# Patient Record
Sex: Male | Born: 1935 | Hispanic: No | Marital: Married | Smoking: Never smoker
Health system: Southern US, Community
[De-identification: ages and names within clinical notes are randomized; demographics above are authoritative.]

## PROBLEM LIST (undated history)

## (undated) DIAGNOSIS — I10 Essential (primary) hypertension: Secondary | ICD-10-CM

## (undated) DIAGNOSIS — N400 Enlarged prostate without lower urinary tract symptoms: Secondary | ICD-10-CM

## (undated) DIAGNOSIS — E119 Type 2 diabetes mellitus without complications: Secondary | ICD-10-CM

## (undated) DIAGNOSIS — I2510 Atherosclerotic heart disease of native coronary artery without angina pectoris: Secondary | ICD-10-CM

## (undated) DIAGNOSIS — M316 Other giant cell arteritis: Secondary | ICD-10-CM

## (undated) HISTORY — PX: CORONARY ANGIOPLASTY WITH STENT PLACEMENT: SHX49

---

## 2015-08-27 ENCOUNTER — Other Ambulatory Visit: Payer: Self-pay

## 2015-08-27 ENCOUNTER — Encounter (HOSPITAL_COMMUNITY): Payer: Self-pay | Admitting: Emergency Medicine

## 2015-08-27 ENCOUNTER — Observation Stay (HOSPITAL_COMMUNITY)
Admission: EM | Admit: 2015-08-27 | Discharge: 2015-08-27 | Disposition: A | Discharge status: Home / Self Care | Payer: Medicare Other | Attending: Family Medicine | Admitting: Family Medicine

## 2015-08-27 ENCOUNTER — Emergency Department (HOSPITAL_COMMUNITY): Payer: Medicare Other

## 2015-08-27 DIAGNOSIS — Z794 Long term (current) use of insulin: Secondary | ICD-10-CM | POA: Diagnosis not present

## 2015-08-27 DIAGNOSIS — Z7982 Long term (current) use of aspirin: Secondary | ICD-10-CM | POA: Insufficient documentation

## 2015-08-27 DIAGNOSIS — R079 Chest pain, unspecified: Secondary | ICD-10-CM | POA: Diagnosis not present

## 2015-08-27 DIAGNOSIS — K210 Gastro-esophageal reflux disease with esophagitis: Secondary | ICD-10-CM | POA: Diagnosis not present

## 2015-08-27 DIAGNOSIS — M25519 Pain in unspecified shoulder: Secondary | ICD-10-CM | POA: Insufficient documentation

## 2015-08-27 DIAGNOSIS — R05 Cough: Secondary | ICD-10-CM

## 2015-08-27 DIAGNOSIS — N400 Enlarged prostate without lower urinary tract symptoms: Secondary | ICD-10-CM | POA: Diagnosis not present

## 2015-08-27 DIAGNOSIS — I10 Essential (primary) hypertension: Secondary | ICD-10-CM | POA: Diagnosis not present

## 2015-08-27 DIAGNOSIS — Z955 Presence of coronary angioplasty implant and graft: Secondary | ICD-10-CM | POA: Insufficient documentation

## 2015-08-27 DIAGNOSIS — I2510 Atherosclerotic heart disease of native coronary artery without angina pectoris: Secondary | ICD-10-CM | POA: Insufficient documentation

## 2015-08-27 DIAGNOSIS — K219 Gastro-esophageal reflux disease without esophagitis: Secondary | ICD-10-CM

## 2015-08-27 DIAGNOSIS — Z7902 Long term (current) use of antithrombotics/antiplatelets: Secondary | ICD-10-CM | POA: Diagnosis not present

## 2015-08-27 DIAGNOSIS — E119 Type 2 diabetes mellitus without complications: Secondary | ICD-10-CM | POA: Diagnosis not present

## 2015-08-27 DIAGNOSIS — D179 Benign lipomatous neoplasm, unspecified: Secondary | ICD-10-CM | POA: Diagnosis not present

## 2015-08-27 DIAGNOSIS — Z79899 Other long term (current) drug therapy: Secondary | ICD-10-CM | POA: Diagnosis not present

## 2015-08-27 DIAGNOSIS — R059 Cough, unspecified: Secondary | ICD-10-CM

## 2015-08-27 HISTORY — DX: Atherosclerotic heart disease of native coronary artery without angina pectoris: I25.10

## 2015-08-27 HISTORY — DX: Type 2 diabetes mellitus without complications: E11.9

## 2015-08-27 HISTORY — DX: Essential (primary) hypertension: I10

## 2015-08-27 HISTORY — DX: Benign prostatic hyperplasia without lower urinary tract symptoms: N40.0

## 2015-08-27 HISTORY — DX: Other giant cell arteritis: M31.6

## 2015-08-27 LAB — I-STAT CHEM 8, ED
BUN: 23 mg/dL — ABNORMAL HIGH (ref 6–20)
CALCIUM ION: 1.28 mmol/L (ref 1.13–1.30)
CHLORIDE: 104 mmol/L (ref 101–111)
Creatinine, Ser: 1.3 mg/dL — ABNORMAL HIGH (ref 0.61–1.24)
Glucose, Bld: 151 mg/dL — ABNORMAL HIGH (ref 65–99)
HCT: 46 % (ref 39.0–52.0)
Hemoglobin: 15.6 g/dL (ref 13.0–17.0)
Potassium: 3.6 mmol/L (ref 3.5–5.1)
SODIUM: 143 mmol/L (ref 135–145)
TCO2: 29 mmol/L (ref 0–100)

## 2015-08-27 LAB — BASIC METABOLIC PANEL
ANION GAP: 7 (ref 5–15)
BUN: 21 mg/dL — ABNORMAL HIGH (ref 6–20)
CO2: 28 mmol/L (ref 22–32)
Calcium: 9.5 mg/dL (ref 8.9–10.3)
Chloride: 106 mmol/L (ref 101–111)
Creatinine, Ser: 1.33 mg/dL — ABNORMAL HIGH (ref 0.61–1.24)
GFR, EST AFRICAN AMERICAN: 55 mL/min — AB (ref >60)
GFR, EST NON AFRICAN AMERICAN: 48 mL/min — AB (ref >60)
Glucose, Bld: 150 mg/dL — ABNORMAL HIGH (ref 65–99)
POTASSIUM: 3.5 mmol/L (ref 3.5–5.1)
SODIUM: 141 mmol/L (ref 135–145)

## 2015-08-27 LAB — CBC
HEMATOCRIT: 41.8 % (ref 39.0–52.0)
HEMOGLOBIN: 13.1 g/dL (ref 13.0–17.0)
MCH: 18.5 pg — ABNORMAL LOW (ref 26.0–34.0)
MCHC: 31.3 g/dL (ref 30.0–36.0)
MCV: 59 fL — ABNORMAL LOW (ref 78.0–100.0)
Platelets: 230 10*3/uL (ref 150–400)
RBC: 7.09 MIL/uL — AB (ref 4.22–5.81)
RDW: 18.6 % — AB (ref 11.5–15.5)
WBC: 7.6 10*3/uL (ref 4.0–10.5)

## 2015-08-27 LAB — I-STAT TROPONIN, ED: Troponin i, poc: 0 ng/mL (ref 0.00–0.08)

## 2015-08-27 MED ORDER — CLONIDINE HCL 0.1 MG PO TABS
0.1000 mg | ORAL_TABLET | Freq: Once | ORAL | Status: AC
  Administered 2015-08-27: 0.1 mg via ORAL
  Filled 2015-08-27: qty 1

## 2015-08-27 MED ORDER — METHOCARBAMOL 500 MG PO TABS: 500.0000 mg | ORAL_TABLET | Freq: Three times a day (TID) | ORAL | Status: DC | PRN

## 2015-08-27 MED ORDER — IOPAMIDOL (ISOVUE-370) INJECTION 76%
INTRAVENOUS | Status: AC
  Administered 2015-08-27: 100 mL
  Filled 2015-08-27: qty 100

## 2015-08-27 MED ORDER — GI COCKTAIL ~~LOC~~
30.0000 mL | Freq: Once | ORAL | Status: AC
  Administered 2015-08-27: 30 mL via ORAL
  Filled 2015-08-27: qty 30

## 2015-08-27 MED ORDER — HYDROCHLOROTHIAZIDE 12.5 MG PO TABS: 12.5000 mg | ORAL_TABLET | Freq: Every day | ORAL | Status: AC

## 2015-08-27 MED ORDER — HYDROCHLOROTHIAZIDE 12.5 MG PO CAPS
12.5000 mg | ORAL_CAPSULE | Freq: Once | ORAL | Status: AC
  Administered 2015-08-27: 12.5 mg via ORAL
  Filled 2015-08-27: qty 1

## 2015-08-27 MED ORDER — ASPIRIN 81 MG PO CHEW
324.0000 mg | CHEWABLE_TABLET | Freq: Once | ORAL | Status: AC
  Administered 2015-08-27: 324 mg via ORAL
  Filled 2015-08-27: qty 4

## 2015-08-27 MED ORDER — METHOCARBAMOL 1000 MG/10ML IJ SOLN
500.0000 mg | Freq: Once | INTRAVENOUS | Status: DC
  Filled 2015-08-27: qty 5

## 2015-08-27 MED ORDER — METHOCARBAMOL 500 MG PO TABS
500.0000 mg | ORAL_TABLET | Freq: Once | ORAL | Status: AC
  Administered 2015-08-27: 500 mg via ORAL
  Filled 2015-08-27: qty 1

## 2015-08-27 NOTE — ED Notes (Signed)
Spoke to Dr. Marily Memos due to pt family requesting to speak to him. MD will go to bedside in order to decide disposition.

## 2015-08-27 NOTE — ED Notes (Signed)
Pt placement and 3W aware that patient will be discharged.

## 2015-08-27 NOTE — ED Notes (Signed)
Admitting MD at bedside.

## 2015-08-27 NOTE — H&P (Deleted)
ENTERED IN ERROR   ROS

## 2015-08-27 NOTE — ED Notes (Signed)
Pt. Stated, I got up this morning and after i did my exercise, I started having some right side chest pain, and my shoulder pain.

## 2015-08-27 NOTE — ED Provider Notes (Signed)
CSN: [id number]     Arrival date & time 08/27/15  [id number] History   First MD Initiated Contact with Patient 08/27/15 [phone]     Chief Complaint  Patient presents with  . Chest Pain  . Shoulder Pain    HPI Comments: 82 year old male with a past medical history of coronary artery disease status post 3 stents and hypertension presents to the ED with sharp left-sided chest pain that is located deep to his scapula. He feels this is related to swimming yesterday, and exercising this morning. He did not have any chest pain while exercising. The pain began 10 minutes after finishing his workout. No associated nausea, vomiting, diaphoresis, syncope. The pain is nonradiating. It lasts "seconds".  Patient is a 82 y.o. male presenting with chest pain. The history is provided by the patient.  Chest Pain Pain location:  L chest (Described as deep inside under the shoulder blade) Pain quality: sharp   Pain radiates to:  Does not radiate Pain severity:  Severe Onset quality:  Sudden Duration: Began this morning; lasts 3 seconds, occurs intermittently. Timing:  Intermittent Progression:  Waxing and waning Chronicity:  New Context: at rest   Relieved by:  None tried Worsened by:  Nothing tried Ineffective treatments:  None tried Associated symptoms: no abdominal pain, no fever, no nausea, no shortness of breath and not vomiting   Risk factors: coronary artery disease and hypertension     Past Medical History  Diagnosis Date  . Hypertension   . Coronary artery disease     stents placed  . BPH (benign prostatic hyperplasia)   . DM (diabetes mellitus) ([place])    Past Surgical History  Procedure Laterality Date  . Coronary angioplasty with stent placement     No family history on file. Social History  Substance Use Topics  . Smoking status: Never Smoker   . Smokeless tobacco: None  . Alcohol Use: Yes    Review of Systems  Constitutional: Negative for fever and chills.  HENT: Negative for  congestion.   Eyes: Negative for redness.  Respiratory: Negative for shortness of breath.   Cardiovascular: Positive for chest pain.  Gastrointestinal: Negative for nausea, vomiting, abdominal pain and diarrhea.  Genitourinary: Negative for flank pain.  Musculoskeletal: Negative for gait problem.  Skin: Negative for rash.  Neurological: Negative for syncope and light-headedness.  Psychiatric/Behavioral: Negative for confusion.    Allergies  Review of patient's allergies indicates not on file.  Home Medications   Prior to Admission medications   Not on File   BP 175/63 mmHg  Pulse 50  Temp(Src) 98.6 F (37 C) (Oral)  Resp 16  Ht 5\' 4"  (1.626 m)  Wt 79.833 kg  BMI 30.20 kg/m2  SpO2 100% Physical Exam  Constitutional: He is oriented to person, place, and time. He appears well-developed and well-nourished. No distress.  HENT:  Head: Normocephalic and atraumatic.  Right Ear: External ear normal.  Left Ear: External ear normal.  Neck: Normal range of motion.  Cardiovascular: Regular rhythm and intact distal pulses.  Bradycardia present.   Pulses:      Radial pulses are 2+ on the right side, and 2+ on the left side.       Posterior tibial pulses are 2+ on the right side, and 2+ on the left side.  HR 50s, intact distal pulses, no peripheral edema  Pulmonary/Chest: Effort normal and breath sounds normal.  Abdominal: Soft. Bowel sounds are normal. He exhibits no distension. There is no tenderness.  Musculoskeletal:       Left shoulder: He exhibits normal range of motion and no tenderness.  LUE: No pain with ROM, normal strength in major muscle groups, no pain with palpation of the scapula  Neurological: He is alert and oriented to person, place, and time.  Normal strength in major muscle groups of upper and lower extremities, face symmetric, speech clear and appropriate  Skin: Skin is warm and dry. He is not diaphoretic.  Psychiatric: He has a normal mood and affect.    ED  Course  Procedures  Labs Review Labs Reviewed  BASIC METABOLIC PANEL - Abnormal; Notable for the following:    Glucose, Bld 150 (*)    BUN 21 (*)    Creatinine, Ser 1.33 (*)    GFR calc non Af Amer 48 (*)    GFR calc Af Amer 55 (*)    All other components within normal limits  CBC - Abnormal; Notable for the following:    RBC 7.09 (*)    MCV 59.0 (*)    MCH 18.5 (*)    RDW 18.6 (*)    All other components within normal limits  I-STAT CHEM 8, ED - Abnormal; Notable for the following:    BUN 23 (*)    Creatinine, Ser 1.30 (*)    Glucose, Bld 151 (*)    All other components within normal limits  I-STAT TROPOININ, ED    Imaging Review Dg Chest 2 View  08/27/2015  CLINICAL DATA:  Right-sided chest pain following exercise, initial encounter EXAM: CHEST  2 VIEW COMPARISON:  None. FINDINGS: The heart size and mediastinal contours are within normal limits. Both lungs are clear. The visualized skeletal structures are unremarkable. IMPRESSION: No active cardiopulmonary disease. Electronically Signed   By: Inez Catalina M.D.   On: 08/27/2015 09:19   Ct Angio Chest Pe W/cm &/or Wo Cm  08/27/2015  CLINICAL DATA:  Sharp left-sided chest pain under the shoulder blade. EXAM: CT ANGIOGRAPHY CHEST WITH CONTRAST TECHNIQUE: Multidetector CT imaging of the chest was performed using the standard protocol during bolus administration of intravenous contrast. Multiplanar CT image reconstructions and MIPs were obtained to evaluate the vascular anatomy. CONTRAST:  100 cc Isovue 370 intravenous COMPARISON:  None. FINDINGS: Mediastinum/Lymph Nodes: Normal heart size. No pericardial effusion. Non opacification of the left heart and systemic arterial tree. No evidence of acute aortic syndrome. No evidence of pulmonary embolism. Atherosclerosis, including the coronary arteries. Negative for adenopathy. Lungs/Pleura: Increased density the lungs is likely from expiratory phase imaging. There is mild air trapping at the  bases. Emphysematous changes at the right more than left apex. No suspicious pulmonary nodule. Upper abdomen: No acute findings. Musculoskeletal: Partially visualized deltoid lipoma on the right measuring at least 4 cm, simple where seen. Diffuse disc degeneration. Advanced bilateral glenohumeral osteoarthritis. Review of the MIP images confirms the above findings. IMPRESSION: 1. No evidence of pulmonary embolism or other acute finding. 2. Aortic atherosclerosis and other chronic findings noted above. Electronically Signed   By: Monte Fantasia M.D.   On: 08/27/2015 10:29   I have personally reviewed and evaluated these images and lab results as part of my medical decision-making.   EKG Interpretation   Date/Time:  Monday August 27 2015 08:24:56 EDT Ventricular Rate:  51 PR Interval:  184 QRS Duration: 86 QT Interval:  450 QTC Calculation: 414 R Axis:   55 Text Interpretation:  Sinus bradycardia Otherwise normal ECG Confirmed by  Lita Mains  MD, DAVID ([id number]) on  08/27/2015 9:07:08 AM      MDM   Final diagnoses:  Chest pain, unspecified chest pain type    82 y.o. male presents with sharp left sided chest pain. Remainder as above. The patient feels that this pain may be attributed to his work out, however he has no reproducible pain over the scapula, and no pain with active ROM of the shoulder.   EKG showed sinus bradycardia.   Given the description of his pain, and location, considered ACS, PE, Aortic dissection. Initial troponin undetectable. CTA showed no PE or aortic dissection. CBC and BMP unremarkable. Aspirin given.   Based on his age, history of CAD with stents, and intermittent chest pain, I discussed his case with the hospitalist for admission. He was admitted to a telemetry bed for further evaluation and management of his chest pain.   1:40 PM Addendum:  Patient was seen and evaluated by Dr. Marily Memos, hospitalist, who recommended admission. The patient preferred to go home. See  note by Dr. Marily Memos. Dr. Marily Memos recommended starting the patient on HCTZ and Robaxin. I discussed this with the patient and he continued to prefer to go home. I discussed very strict return precautions with him and his family. Discharged home.   Case managed in conjunction with my attending, Dr. Lita Mains.   Berenice Primas, MD 08/27/15 1151  Berenice Primas, MD 08/27/15 [id number]  Julianne Rice, MD 08/31/15 [id number]

## 2015-08-27 NOTE — Discharge Instructions (Signed)

## 2015-08-27 NOTE — Consult Note (Signed)
Medical Consultation   Peter Carter  [id number]  DOB: 07/02/1929  DOA: 08/27/2015  PCP: No primary care provider on file.   Outpatient Specialists: Cardiology  Requesting physician: Dr Marigene Ehlers  Reason for consultation: CP, HTN   History of Present Illness: Patient coming from: Home - visiting from [place]  Chief Complaint: CP  HPI: Peter Carter is a 82 y.o. male with medical history significant of hypertension, CAD status post cardiac catheterization with stent placement BPH, GERD, diabetes presenting w/ CP Scapular pain. It patient states it is near the scapula in the chest. Patient is very active and went swimming one day ago which is part of his usual routine. He does not have any chest pain during exertion. Pain began 10 minutes after finishing his workout. The pain typically lasts several seconds and then resolve spontaneously. No change with movement or deep inspiration. Patient is not tried anything for his symptoms. Pain often comes in 3- 4 episodes in rapid succession lasting only a few seconds at time and roughly 10 seconds apart. Pt also endorses worsening cough lately which he attributes to his reflux. Worse since coming down from [place] to visit. Pt states he had a cardiac cath a few months ago which was normal.   Denies fevers, productive cough, shortness of breath, rash, neck stiffness, melena, headache, dysuria, frequency, flank pain, abdominal pain, diarrhea, nausea, diaphoresis.  ED Course: No meds given, CTA chest without evidence of acute process. Bony negative and EKG without evidence of ACS    Ambulatory Status: no restrictions    ROS Review of Systems: As per HPI otherwise 10 point review of systems negative.   Past Medical History: Past Medical History  Diagnosis Date  . Hypertension   . Coronary artery disease     stents placed  . BPH (benign prostatic hyperplasia)   . DM (diabetes mellitus) ([place])   . Giant cell arteritis [facility])      Past Surgical History: Past Surgical History  Procedure Laterality Date  . Coronary angioplasty with stent placement       Allergies:  No Known Allergies   Social History:  reports that he has never smoked. He does not have any smokeless tobacco history on file. He reports that he drinks alcohol. He reports that he does not use illicit drugs.   Family History: Family History  Problem Relation Age of Onset  . Stroke Mother      Physical Exam: Filed Vitals:   08/27/15 1257 08/27/15 1300 08/27/15 1315 08/27/15 1317  BP: 170/63 171/62 161/56 161/56  Pulse:  46 45 46  Temp:    98 F (36.7 C)  TempSrc:    Oral  Resp:  14 14 13   Height:      Weight:      SpO2:  98% 98% 98%   Prior to Admission medications   Medication Sig Start Date End Date Taking? Authorizing Provider  Ascorbic Acid (VITAMIN C) 100 MG tablet Take 100 mg by mouth daily.   Yes Historical Provider, MD  aspirin 81 MG tablet Take 81 mg by mouth daily.   Yes Historical Provider, MD  atenolol (TENORMIN) 25 MG tablet Take 25 mg by mouth daily.   Yes Historical Provider, MD  Cholecalciferol (VITAMIN D PO) Take 1 capsule by mouth daily.   Yes Historical Provider, MD  clopidogrel (PLAVIX) 75 MG tablet Take 75 mg by mouth daily.   Yes Historical Provider,  MD  Cyanocobalamin (VITAMIN B 12 PO) Take 1 tablet by mouth daily.   Yes Historical Provider, MD  dutasteride (AVODART) 0.5 MG capsule Take 0.5 mg by mouth daily.   Yes Historical Provider, MD  esomeprazole (NEXIUM) 40 MG capsule Take 40 mg by mouth daily at 12 noon.   Yes Historical Provider, MD  fenofibrate (TRICOR) 145 MG tablet Take 145 mg by mouth daily.   Yes Historical Provider, MD  finasteride (PROSCAR) 5 MG tablet Take 5 mg by mouth daily.   Yes Historical Provider, MD  insulin glargine (LANTUS) 100 UNIT/ML injection Inject 15-22 Units into the skin at bedtime.   Yes Historical Provider, MD  insulin lispro protamine-lispro (HUMALOG 50/50 MIX) (50-50)  100 UNIT/ML SUSP injection Inject 30-34 Units into the skin 3 (three) times daily. Per sliding scale   Yes Historical Provider, MD  Omega-3 Fatty Acids (FISH OIL PO) Take 1 capsule by mouth daily.   Yes Historical Provider, MD  predniSONE (DELTASONE) 1 MG tablet Take 1 mg by mouth 4 (four) times daily.   Yes Historical Provider, MD  tamsulosin (FLOMAX) 0.4 MG CAPS capsule Take 0.4 mg by mouth daily.   Yes Historical Provider, MD    Physical Exam: Filed Vitals:   08/27/15 1257 08/27/15 1300 08/27/15 1315 08/27/15 1317  BP: 170/63 171/62 161/56 161/56  Pulse:  46 45 46  Temp:    98 F (36.7 C)  TempSrc:    Oral  Resp:  14 14 13   Height:      Weight:      SpO2:  98% 98% 98%     General:  Appears calm and comfortable Eyes:  PERRL, EOMI, normal lids, iris ENT:  grossly normal hearing, lips & tongue, mmm Neck:  no LAD, masses or thyromegaly Cardiovascular:  RRR, no m/r/g. No LE edema.  Respiratory:  CTA bilaterally, no w/r/r. Normal respiratory effort. Abdomen:  soft, ntnd, NABS Skin:  no rash or induration seen on limited exam Musculoskeletal: Pain reproducible upon palpation of large lipoma in the left scapular region, no bony abnormalities, grossly normal tone BUE/BLE, Psychiatric:  grossly normal mood and affect, speech fluent and appropriate, AOx3 Neurologic:  CN 2-12 grossly intact, moves all extremities in coordinated fashion, sensation intact  Data reviewed:  I have personally reviewed following labs and imaging studies Labs:  CBC:  Recent Labs Lab 08/27/15 0841 08/27/15 0918  WBC 7.6  --   HGB 13.1 15.6  HCT 41.8 46.0  MCV 59.0*  --   PLT 230  --     Basic Metabolic Panel:  Recent Labs Lab 08/27/15 0841 08/27/15 0918  NA 141 143  K 3.5 3.6  CL 106 104  CO2 28  --   GLUCOSE 150* 151*  BUN 21* 23*  CREATININE 1.33* 1.30*  CALCIUM 9.5  --    GFR Estimated Creatinine Clearance: 41 mL/min (by C-G formula based on Cr of 1.3). Liver Function Tests: No  results for input(s): AST, ALT, ALKPHOS, BILITOT, PROT, ALBUMIN in the last 168 hours. No results for input(s): LIPASE, AMYLASE in the last 168 hours. No results for input(s): AMMONIA in the last 168 hours. Coagulation profile No results for input(s): INR, PROTIME in the last 168 hours.  Cardiac Enzymes: No results for input(s): CKTOTAL, CKMB, CKMBINDEX, TROPONINI in the last 168 hours. BNP: Invalid input(s): POCBNP CBG: No results for input(s): GLUCAP in the last 168 hours. D-Dimer No results for input(s): DDIMER in the last 72 hours. Hgb A1c No  results for input(s): HGBA1C in the last 72 hours. Lipid Profile No results for input(s): CHOL, HDL, LDLCALC, TRIG, CHOLHDL, LDLDIRECT in the last 72 hours. Thyroid function studies No results for input(s): TSH, T4TOTAL, T3FREE, THYROIDAB in the last 72 hours.  Invalid input(s): FREET3 Anemia work up No results for input(s): VITAMINB12, FOLATE, FERRITIN, TIBC, IRON, RETICCTPCT in the last 72 hours. Urinalysis No results found for: COLORURINE, APPEARANCEUR, LABSPEC, [place], GLUCOSEU, HGBUR, BILIRUBINUR, KETONESUR, PROTEINUR, UROBILINOGEN, NITRITE, [place]   Microbiology No results found for this or any previous visit (from the past 240 hour(s)).     Inpatient Medications:   Scheduled Meds: Continuous Infusions:   Radiological Exams on Admission: Dg Chest 2 View  08/27/2015  CLINICAL DATA:  Right-sided chest pain following exercise, initial encounter EXAM: CHEST  2 VIEW COMPARISON:  None. FINDINGS: The heart size and mediastinal contours are within normal limits. Both lungs are clear. The visualized skeletal structures are unremarkable. IMPRESSION: No active cardiopulmonary disease. Electronically Signed   By: Inez Catalina M.D.   On: 08/27/2015 09:19   Ct Angio Chest Pe W/cm &/or Wo Cm  08/27/2015  CLINICAL DATA:  Sharp left-sided chest pain under the shoulder blade. EXAM: CT ANGIOGRAPHY CHEST WITH CONTRAST TECHNIQUE:  Multidetector CT imaging of the chest was performed using the standard protocol during bolus administration of intravenous contrast. Multiplanar CT image reconstructions and MIPs were obtained to evaluate the vascular anatomy. CONTRAST:  100 cc Isovue 370 intravenous COMPARISON:  None. FINDINGS: Mediastinum/Lymph Nodes: Normal heart size. No pericardial effusion. Non opacification of the left heart and systemic arterial tree. No evidence of acute aortic syndrome. No evidence of pulmonary embolism. Atherosclerosis, including the coronary arteries. Negative for adenopathy. Lungs/Pleura: Increased density the lungs is likely from expiratory phase imaging. There is mild air trapping at the bases. Emphysematous changes at the right more than left apex. No suspicious pulmonary nodule. Upper abdomen: No acute findings. Musculoskeletal: Partially visualized deltoid lipoma on the right measuring at least 4 cm, simple where seen. Diffuse disc degeneration. Advanced bilateral glenohumeral osteoarthritis. Review of the MIP images confirms the above findings. IMPRESSION: 1. No evidence of pulmonary embolism or other acute finding. 2. Aortic atherosclerosis and other chronic findings noted above. Electronically Signed   By: Monte Fantasia M.D.   On: 08/27/2015 10:29    Impression/Recommendations Active Problems:   Chest pain   Essential hypertension   Lipoma   GERD (gastroesophageal reflux disease)   Cough      CP: actually back pain that is reproducible at the location of a lipoma. Chest pain is nonexertional. Troponin negative, EKG without sign of ACS. Cardiac catheterization approximately one year ago without evidence of significant stenosis. This came per report by patient's daughter who called the cardiologist while the patient was in the emergency room. HEART score 4. Patient given the option of staying for serial troponins and telemetry monitoring but deferred as he states this is not like cardiac heart  pain. Of note patient is extremely active and never has exertional chest pain during activity. Patient will be going back to [place] in 2 days and strongly recommended he follow-up with his cardiologist when he gets back to [place]. Patient to return here if he has additional symptoms. Patient was provided with a dose of Robaxin and a GI cocktail. Patient will also follow-up with your primary care doctor or general surgery for excision of what is likely a lipoma versus a subcutaneous cyst. Additionally some of patient's symptoms may be  likely due to a cough causing pain as sometimes this does reproduce the pain as well. Suspect the patient's cough is likely from reflux as he has significant disease in this area. Symptoms of and worsens coming down from [place] with a change in his diet. Patient given a dose of GI cocktail with some improvement. The patient's blood pressure was intermittently elevated during his stay here suspect this of this was due to stress and pain. Patient was given a dose of clonidine with improvement in his blood pressure as well as hydralazine prior to discharge. Recommending a timed discharge patient given a perception for hydrochlorothiazide 12.5 mg as well as Robaxin for additional pain and pressure relief.  Thank you for this consultation.  Our [facility] hospitalist team will follow the patient with you.    Lashawnda Hancox J M.D. [facility] 08/27/2015, 3:11 PM

## 2016-10-23 ENCOUNTER — Emergency Department (HOSPITAL_COMMUNITY): Payer: Medicare Other

## 2016-10-23 ENCOUNTER — Other Ambulatory Visit: Payer: Self-pay

## 2016-10-23 ENCOUNTER — Observation Stay (HOSPITAL_COMMUNITY)
Admission: EM | Admit: 2016-10-23 | Discharge: 2016-10-24 | Disposition: A | Discharge status: Home / Self Care | Payer: Medicare Other | Attending: Internal Medicine | Admitting: Internal Medicine

## 2016-10-23 ENCOUNTER — Encounter (HOSPITAL_COMMUNITY): Payer: Self-pay | Admitting: Emergency Medicine

## 2016-10-23 DIAGNOSIS — M315 Giant cell arteritis with polymyalgia rheumatica: Secondary | ICD-10-CM | POA: Insufficient documentation

## 2016-10-23 DIAGNOSIS — Z7982 Long term (current) use of aspirin: Secondary | ICD-10-CM | POA: Insufficient documentation

## 2016-10-23 DIAGNOSIS — Z7952 Long term (current) use of systemic steroids: Secondary | ICD-10-CM | POA: Diagnosis not present

## 2016-10-23 DIAGNOSIS — I10 Essential (primary) hypertension: Secondary | ICD-10-CM | POA: Diagnosis not present

## 2016-10-23 DIAGNOSIS — N400 Enlarged prostate without lower urinary tract symptoms: Secondary | ICD-10-CM | POA: Insufficient documentation

## 2016-10-23 DIAGNOSIS — Z794 Long term (current) use of insulin: Secondary | ICD-10-CM | POA: Insufficient documentation

## 2016-10-23 DIAGNOSIS — I252 Old myocardial infarction: Secondary | ICD-10-CM | POA: Diagnosis not present

## 2016-10-23 DIAGNOSIS — E119 Type 2 diabetes mellitus without complications: Secondary | ICD-10-CM | POA: Insufficient documentation

## 2016-10-23 DIAGNOSIS — D563 Thalassemia minor: Secondary | ICD-10-CM | POA: Diagnosis not present

## 2016-10-23 DIAGNOSIS — I2510 Atherosclerotic heart disease of native coronary artery without angina pectoris: Principal | ICD-10-CM | POA: Insufficient documentation

## 2016-10-23 DIAGNOSIS — Z7983 Long term (current) use of bisphosphonates: Secondary | ICD-10-CM | POA: Diagnosis not present

## 2016-10-23 DIAGNOSIS — Z79899 Other long term (current) drug therapy: Secondary | ICD-10-CM | POA: Insufficient documentation

## 2016-10-23 DIAGNOSIS — Z955 Presence of coronary angioplasty implant and graft: Secondary | ICD-10-CM | POA: Diagnosis not present

## 2016-10-23 DIAGNOSIS — Z7902 Long term (current) use of antithrombotics/antiplatelets: Secondary | ICD-10-CM | POA: Insufficient documentation

## 2016-10-23 DIAGNOSIS — K219 Gastro-esophageal reflux disease without esophagitis: Secondary | ICD-10-CM | POA: Diagnosis not present

## 2016-10-23 DIAGNOSIS — R079 Chest pain, unspecified: Secondary | ICD-10-CM | POA: Diagnosis present

## 2016-10-23 LAB — BASIC METABOLIC PANEL
ANION GAP: 6 (ref 5–15)
BUN: 21 mg/dL — ABNORMAL HIGH (ref 6–20)
CO2: 25 mmol/L (ref 22–32)
Calcium: 9.1 mg/dL (ref 8.9–10.3)
Chloride: 104 mmol/L (ref 101–111)
Creatinine, Ser: 1.36 mg/dL — ABNORMAL HIGH (ref 0.61–1.24)
GFR calc non Af Amer: 46 mL/min — ABNORMAL LOW (ref >60)
GFR, EST AFRICAN AMERICAN: 53 mL/min — AB (ref >60)
GLUCOSE: 122 mg/dL — AB (ref 65–99)
POTASSIUM: 4.8 mmol/L (ref 3.5–5.1)
SODIUM: 135 mmol/L (ref 135–145)

## 2016-10-23 LAB — CBC
HEMATOCRIT: 39.9 % (ref 39.0–52.0)
HEMOGLOBIN: 13.2 g/dL (ref 13.0–17.0)
MCH: 19.8 pg — ABNORMAL LOW (ref 26.0–34.0)
MCHC: 33.1 g/dL (ref 30.0–36.0)
MCV: 59.7 fL — ABNORMAL LOW (ref 78.0–100.0)
Platelets: 213 10*3/uL (ref 150–400)
RBC: 6.68 MIL/uL — AB (ref 4.22–5.81)
RDW: 17.3 % — ABNORMAL HIGH (ref 11.5–15.5)
WBC: 10.7 10*3/uL — ABNORMAL HIGH (ref 4.0–10.5)

## 2016-10-23 LAB — I-STAT TROPONIN, ED
TROPONIN I, POC: 0 ng/mL (ref 0.00–0.08)
Troponin i, poc: 0.02 ng/mL (ref 0.00–0.08)

## 2016-10-23 NOTE — ED Triage Notes (Signed)
Onset today developed chest spasms and tightness intermittently. Denies shortness of breath and currently denies pain.  Patient had cardiac stents placed 2 months ago.

## 2016-10-23 NOTE — ED Triage Notes (Signed)
Patient's daughter came up to nurse's station, stating the patient is from [place] and his cardiologist is faxing over paperwork and recommends patient be admitted overnight for a stress test.

## 2016-10-24 ENCOUNTER — Encounter (HOSPITAL_COMMUNITY): Payer: Self-pay | Admitting: Family Medicine

## 2016-10-24 ENCOUNTER — Other Ambulatory Visit: Payer: Self-pay

## 2016-10-24 DIAGNOSIS — I2583 Coronary atherosclerosis due to lipid rich plaque: Secondary | ICD-10-CM | POA: Diagnosis not present

## 2016-10-24 DIAGNOSIS — R079 Chest pain, unspecified: Secondary | ICD-10-CM | POA: Diagnosis not present

## 2016-10-24 DIAGNOSIS — I10 Essential (primary) hypertension: Secondary | ICD-10-CM | POA: Diagnosis not present

## 2016-10-24 DIAGNOSIS — K210 Gastro-esophageal reflux disease with esophagitis: Secondary | ICD-10-CM | POA: Diagnosis not present

## 2016-10-24 DIAGNOSIS — Z794 Long term (current) use of insulin: Secondary | ICD-10-CM | POA: Diagnosis not present

## 2016-10-24 DIAGNOSIS — I2510 Atherosclerotic heart disease of native coronary artery without angina pectoris: Secondary | ICD-10-CM | POA: Diagnosis not present

## 2016-10-24 DIAGNOSIS — E119 Type 2 diabetes mellitus without complications: Secondary | ICD-10-CM

## 2016-10-24 DIAGNOSIS — R072 Precordial pain: Secondary | ICD-10-CM

## 2016-10-24 LAB — TROPONIN I

## 2016-10-24 LAB — CBG MONITORING, ED
GLUCOSE-CAPILLARY: 205 mg/dL — AB (ref 65–99)
GLUCOSE-CAPILLARY: 187 mg/dL — AB (ref 65–99)

## 2016-10-24 MED ORDER — HEPARIN SODIUM (PORCINE) 5000 UNIT/ML IJ SOLN
5000.0000 [IU] | Freq: Three times a day (TID) | INTRAMUSCULAR | Status: DC
  Administered 2016-10-24: 5000 [IU] via SUBCUTANEOUS
  Filled 2016-10-24: qty 1

## 2016-10-24 MED ORDER — FENOFIBRATE 160 MG PO TABS
160.0000 mg | ORAL_TABLET | Freq: Every day | ORAL | Status: DC
  Filled 2016-10-24: qty 1

## 2016-10-24 MED ORDER — DUTASTERIDE 0.5 MG PO CAPS
0.5000 mg | ORAL_CAPSULE | Freq: Every day | ORAL | Status: DC
  Filled 2016-10-24: qty 1

## 2016-10-24 MED ORDER — ASPIRIN 81 MG PO CHEW
81.0000 mg | CHEWABLE_TABLET | Freq: Every day | ORAL | Status: DC
  Filled 2016-10-24: qty 1

## 2016-10-24 MED ORDER — VITAMIN C 100 MG PO TABS: 100.0000 mg | ORAL_TABLET | Freq: Every day | ORAL | Status: DC

## 2016-10-24 MED ORDER — ONDANSETRON HCL 4 MG/2ML IJ SOLN: 4.0000 mg | Freq: Four times a day (QID) | INTRAMUSCULAR | Status: DC | PRN

## 2016-10-24 MED ORDER — MORPHINE SULFATE (PF) 4 MG/ML IV SOLN: 2.0000 mg | INTRAVENOUS | Status: DC | PRN

## 2016-10-24 MED ORDER — ASPIRIN 81 MG PO CHEW
324.0000 mg | CHEWABLE_TABLET | ORAL | Status: AC
  Administered 2016-10-24: 324 mg via ORAL
  Filled 2016-10-24: qty 4

## 2016-10-24 MED ORDER — CLOPIDOGREL BISULFATE 75 MG PO TABS
75.0000 mg | ORAL_TABLET | Freq: Every day | ORAL | Status: DC
  Administered 2016-10-24: 75 mg via ORAL
  Filled 2016-10-24: qty 1

## 2016-10-24 MED ORDER — NITROGLYCERIN 0.4 MG SL SUBL: 0.4000 mg | SUBLINGUAL_TABLET | SUBLINGUAL | Status: DC | PRN

## 2016-10-24 MED ORDER — VITAMIN D 1000 UNITS PO TABS: 1000.0000 [IU] | ORAL_TABLET | Freq: Every day | ORAL | Status: DC

## 2016-10-24 MED ORDER — ACETAMINOPHEN 325 MG PO TABS: 650.0000 mg | ORAL_TABLET | ORAL | Status: DC | PRN

## 2016-10-24 MED ORDER — ATENOLOL 25 MG PO TABS
25.0000 mg | ORAL_TABLET | Freq: Every day | ORAL | Status: DC
  Filled 2016-10-24: qty 1

## 2016-10-24 MED ORDER — HYDROCHLOROTHIAZIDE 25 MG PO TABS
12.5000 mg | ORAL_TABLET | Freq: Every day | ORAL | Status: DC
  Administered 2016-10-24: 12.5 mg via ORAL
  Filled 2016-10-24: qty 1

## 2016-10-24 MED ORDER — ACETAMINOPHEN 325 MG PO TABS: 650.0000 mg | ORAL_TABLET | Freq: Four times a day (QID) | ORAL | Status: DC | PRN

## 2016-10-24 MED ORDER — PANTOPRAZOLE SODIUM 40 MG PO TBEC
40.0000 mg | DELAYED_RELEASE_TABLET | Freq: Every day | ORAL | Status: DC
  Administered 2016-10-24: 40 mg via ORAL
  Filled 2016-10-24: qty 1

## 2016-10-24 MED ORDER — INSULIN ASPART 100 UNIT/ML ~~LOC~~ SOLN: 0.0000 [IU] | SUBCUTANEOUS | Status: DC

## 2016-10-24 MED ORDER — LORAZEPAM 1 MG PO TABS
0.5000 mg | ORAL_TABLET | Freq: Four times a day (QID) | ORAL | Status: DC | PRN
  Administered 2016-10-24: 1 mg via ORAL
  Filled 2016-10-24: qty 1

## 2016-10-24 MED ORDER — FINASTERIDE 5 MG PO TABS
5.0000 mg | ORAL_TABLET | Freq: Every day | ORAL | Status: DC
  Filled 2016-10-24: qty 1

## 2016-10-24 MED ORDER — HYDROCODONE-ACETAMINOPHEN 5-325 MG PO TABS: 1.0000 | ORAL_TABLET | ORAL | Status: DC | PRN

## 2016-10-24 MED ORDER — TAMSULOSIN HCL 0.4 MG PO CAPS: 0.4000 mg | ORAL_CAPSULE | Freq: Every day | ORAL | Status: DC

## 2016-10-24 MED ORDER — PREDNISONE 1 MG PO TABS: 1.0000 mg | ORAL_TABLET | Freq: Four times a day (QID) | ORAL | Status: DC

## 2016-10-24 MED ORDER — OMEGA-3-ACID ETHYL ESTERS 1 G PO CAPS
1.0000 g | ORAL_CAPSULE | Freq: Every day | ORAL | Status: DC
  Filled 2016-10-24: qty 1

## 2016-10-24 MED ORDER — METHOCARBAMOL 500 MG PO TABS: 500.0000 mg | ORAL_TABLET | Freq: Three times a day (TID) | ORAL | Status: DC | PRN

## 2016-10-24 MED ORDER — INSULIN GLARGINE 100 UNIT/ML ~~LOC~~ SOLN: 15.0000 [IU] | Freq: Every day | SUBCUTANEOUS | Status: DC

## 2016-10-24 MED ORDER — PREDNISONE 5 MG PO TABS
5.0000 mg | ORAL_TABLET | Freq: Every day | ORAL | Status: DC
  Filled 2016-10-24: qty 1

## 2016-10-24 MED ORDER — ASPIRIN 300 MG RE SUPP: 300.0000 mg | RECTAL | Status: AC

## 2016-10-24 NOTE — H&P (Signed)
History and Physical    Peter Carter [id number]:[id number] DOB: June 06, 1929 DOA: 10/23/2016  PCP: System, Pcp Not In   Patient coming from: Home  Chief Complaint: Chest pain   HPI: Peter Carter is a 83 y.o. male with medical history significant for insulin-dependent diabetes mellitus, hypertension, giant cell arteritis, and coronary artery disease with 2 stents placed in June 2018, now presenting to the emergency department for evaluation of chest pain. Patient is from [place] state her he receives the majority of his care and had the 2 stents placed for an MI in June 2018. He has been doing well since that time, reports adherence with his antiplatelets, and had not experienced any angina until he was woken at 7 AM by sharp pain in the left chest that resolves spontaneously and in less than a minute. The character of this pain was the same as what he experienced with his MI, but the location was slightly different, occurring more laterally today. He had a recurrence in this several times this morning with no alleviating or exacerbating factor identified, resolving spontaneously within about a minute each time. There was no associated dyspnea, nausea, or sweats. He did not attempt any interventions for his symptoms. He notes that he has been doing a lot of swimming lately and was doing some upper body aerobic exercises that he had not done in a long time.  ED Course: Upon arrival to the ED, patient is found to be afebrile, saturating well on room air, bradycardic in the upper 40s to low 50s, and with vitals otherwise stable. EKG features a sinus bradycardia with rate 52 and is otherwise normal. Chest x-ray is negative for acute cardiopulmonary disease. Chemstrip panel reveals a creatinine of 1.36 which appears consistent with his baseline. CBC features a slight leukocytosis to 10,700 and a stable chronic microcytosis without anemia, MCV 59.7. Troponin is within normal limits 2 in the ED. The patient's  cardiologist from [place] faxed over report from the catheterization in June and recommends that the patient be admitted for stress testing if cardiac enzymes remain negative. Patient has not had any pain while in the emergency department and is remained hemodynamically stable. He will be observed on the telemetry unit for ongoing evaluation and management of fleeting left-sided chest pains, likely musculoskeletal, but in a patient with known CAD and recent stents raising some concern for unstable angina.  Review of Systems:  All other systems reviewed and apart from HPI, are negative.  Past Medical History:  Diagnosis Date  . BPH (benign prostatic hyperplasia)   . Coronary artery disease    stents placed  . DM (diabetes mellitus) ([place])   . Giant cell arteritis ([place])   . Hypertension     Past Surgical History:  Procedure Laterality Date  . CORONARY ANGIOPLASTY WITH STENT PLACEMENT       reports that he has never smoked. He does not have any smokeless tobacco history on file. He reports that he drinks alcohol. He reports that he does not use drugs.  No Known Allergies  Family History  Problem Relation Age of Onset  . Stroke Mother      Prior to Admission medications   Medication Sig Start Date End Date Taking? Authorizing Provider  Ascorbic Acid (VITAMIN C) 100 MG tablet Take 100 mg by mouth daily.    [provider]  aspirin 81 MG tablet Take 81 mg by mouth daily.    [provider]  atenolol (TENORMIN) 25 MG tablet  Take 25 mg by mouth daily.    [provider]  Cholecalciferol (VITAMIN D PO) Take 1 capsule by mouth daily.    [provider]  clopidogrel (PLAVIX) 75 MG tablet Take 75 mg by mouth daily.    [provider]  Cyanocobalamin (VITAMIN B 12 PO) Take 1 tablet by mouth daily.    [provider]  dutasteride (AVODART) 0.5 MG capsule Take 0.5 mg by mouth daily.    [provider]  esomeprazole (NEXIUM) 40 MG  capsule Take 40 mg by mouth daily at 12 noon.    [provider]  fenofibrate (TRICOR) 145 MG tablet Take 145 mg by mouth daily.    [provider]  finasteride (PROSCAR) 5 MG tablet Take 5 mg by mouth daily.    [provider]  hydrochlorothiazide (HYDRODIURIL) 12.5 MG tablet Take 1 tablet (12.5 mg total) by mouth daily. 08/27/15   Berenice Primas, MD  insulin glargine (LANTUS) 100 UNIT/ML injection Inject 15-22 Units into the skin at bedtime.    [provider]  insulin lispro protamine-lispro (HUMALOG 50/50 MIX) (50-50) 100 UNIT/ML SUSP injection Inject 30-34 Units into the skin 3 (three) times daily. Per sliding scale    [provider]  methocarbamol (ROBAXIN) 500 MG tablet Take 1 tablet (500 mg total) by mouth every 8 (eight) hours as needed for muscle spasms. 08/27/15   Berenice Primas, MD  Omega-3 Fatty Acids (FISH OIL PO) Take 1 capsule by mouth daily.    [provider]  predniSONE (DELTASONE) 1 MG tablet Take 1 mg by mouth 4 (four) times daily.    [provider]  tamsulosin (FLOMAX) 0.4 MG CAPS capsule Take 0.4 mg by mouth daily.    [provider]    Physical Exam: Vitals:   10/23/16 1839 10/23/16 1840 10/24/16 0030 10/24/16 0100  BP: (!) 149/70  (!) 161/52 (!) 166/54  Pulse: (!) 48  (!) 51 (!) 52  Resp: 19  20 18   Temp: 97.9 F (36.6 C)     TempSrc: Oral     SpO2: 96%  96% 96%  Weight:  80.7 kg (178 lb)    Height:  5\' 5"  (1.651 m)        Constitutional: NAD, calm, comfortable Eyes: PERTLA, lids and conjunctivae normal ENMT: Mucous membranes are moist. Posterior pharynx clear of any exudate or lesions.   Neck: normal, supple, no masses, no thyromegaly Respiratory: clear to auscultation bilaterally, no wheezing, no crackles. Normal respiratory effort.  Cardiovascular: Rate ~50 and regular. No extremity edema. No significant JVD. Abdomen: No distension, no tenderness, no masses palpated. Bowel sounds normal.    Musculoskeletal: no clubbing / cyanosis. No joint deformity upper and lower extremities.   Skin: no significant rashes, lesions, ulcers. Warm, dry, well-perfused. Neurologic: CN 2-12 grossly intact. Sensation intact, DTR normal. Strength 5/5 in all 4 limbs.  Psychiatric: Alert and oriented x 3. Anxious, but very pleasant and cooperative.     Labs on Admission: I have personally reviewed following labs and imaging studies  CBC:  Recent Labs Lab 10/23/16 1854  WBC 10.7*  HGB 13.2  HCT 39.9  MCV 59.7*  PLT 440   Basic Metabolic Panel:  Recent Labs Lab 10/23/16 1854  NA 135  K 4.8  CL 104  CO2 25  GLUCOSE 122*  BUN 21*  CREATININE 1.36*  CALCIUM 9.1   GFR: Estimated Creatinine Clearance: 39.6 mL/min (A) (by C-G formula based on SCr of 1.36 mg/dL (H)).  Liver Function Tests: No results for input(s): AST, ALT, ALKPHOS, BILITOT, PROT, ALBUMIN in the last 168 hours. No results for input(s): LIPASE, AMYLASE in the last 168 hours. No results for input(s): AMMONIA in the last 168 hours. Coagulation Profile: No results for input(s): INR, PROTIME in the last 168 hours. Cardiac Enzymes: No results for input(s): CKTOTAL, CKMB, CKMBINDEX, TROPONINI in the last 168 hours. BNP (last 3 results) No results for input(s): PROBNP in the last 8760 hours. HbA1C: No results for input(s): HGBA1C in the last 72 hours. CBG: No results for input(s): GLUCAP in the last 168 hours. Lipid Profile: No results for input(s): CHOL, HDL, LDLCALC, TRIG, CHOLHDL, LDLDIRECT in the last 72 hours. Thyroid Function Tests: No results for input(s): TSH, T4TOTAL, FREET4, T3FREE, THYROIDAB in the last 72 hours. Anemia Panel: No results for input(s): VITAMINB12, FOLATE, FERRITIN, TIBC, IRON, RETICCTPCT in the last 72 hours. Urine analysis: No results found for: COLORURINE, APPEARANCEUR, LABSPEC, PHURINE, GLUCOSEU, HGBUR, BILIRUBINUR, KETONESUR, PROTEINUR, UROBILINOGEN, NITRITE, LEUKOCYTESUR Sepsis  Labs: @LABRCNTIP (procalcitonin:4,lacticidven:4) )No results found for this or any previous visit (from the past 240 hour(s)).   Radiological Exams on Admission: Dg Chest 2 View  Result Date: 10/23/2016 CLINICAL DATA:  Chest pain. EXAM: CHEST  2 VIEW COMPARISON:  Radiographs of August 27, 2015. FINDINGS: The heart size and mediastinal contours are within normal limits. Both lungs are clear. No pneumothorax or pleural effusion is noted. The visualized skeletal structures are unremarkable. IMPRESSION: No active cardiopulmonary disease. Electronically Signed   By: Marijo Conception, M.D.   On: 10/23/2016 19:39    EKG: Independently reviewed. Sinus bradycardia (rate 52), otherwise normal.   Assessment/Plan  1. Chest pain, CAD  - Pt presents with fleeting left-sided chest pains today, waking him from sleep  - Describes the character as same as with recent MI, but location more lateral  - Recent increase in activity level and new upper-body exercises raise suspicion for MSK etiology  - EKG with sinus bradycardia, but no appreciable ST-T abnormality; CXR unremarkable, and troponin wnl x2 - Pt had 2 stents placed in [place] last July; his cardiologist has faxed report which will be put in his chart - Plan to observe on telemetry, treat with ASA 324 mg now, NTG prn angina, repeat EKG, continue serial troponin measurments - Continue antiplatelets, fibrate and Lovaza, beta-blocker, aspirin   2. Insulin-dependent DM  - No A1c on file  - Managed at home with Lantus 15-22 qHS and Humalog 50/50 30-34 units TID  - Check CBG with meals and qHS  - Start Lantus 15 units qHS with a moderate-intensity SSI   3. Hypertension  - BP is at goal  - Continue HCTZ and atenolol as tolerated   4. GERD  - No EGD report on file  - Managed at home with daily PPI, will continue    5. Sinus bradycardia  - HR in high-40's to low-50's in ED  - Asymptomatic in ED  - Likely secondary to atenolol; will place holding  parameters  - Ischemic etiology possible and is being evaluated as above with serial enzymes  - Check TSH, continue cardiac monitoring   6. Giant cell arteritis  - No suggestive sxs on admission  - Continue current management with prednisone    DVT prophylaxis: sq heparin  Code Status: Full  Family Communication: Daughter updated at bedside Disposition Plan: Observe on telemetry Consults called: Cardiology asked to see Admission status: Observation    Vianne Bulls, MD [facility]  Pager [phone]  If 7PM-7AM, please contact night-coverage [email or web address] Password [email or web address]  10/24/2016, 2:01 AM

## 2016-10-24 NOTE — Progress Notes (Signed)
Progress Note  Patient Name: Peter Carter Date of Encounter: 10/24/2016  Primary Cardiologist: Drema Pry [facility])  Subjective   Pt is an 83yo male pmh w CAD s/p LAD, LCx, RCA stents, with latest stents in LCx back in June of this year,  T2DM insulin dependent, polymyalgia rheumatica, giant cell arteritis, HTN, Beta thalassemia trait, presents with chest pain that began at 7am on 10/23/16 that awoke pt from sleep, pain lasted about 5 seconds, was sharp, then came back later in the morning.  Pain was not associated with exertion, no diaphoresis, nausea, vomiting, diarrhea or shortness of breath occurred with the pain.  Pt had no chest pain during our interview other than reproducible pain in his left axillary area.  He reports that he has done an exercise at the gym that he hasn't done in over a year and wonders if it could be from that.  Pt has been very active at home since the stent and until yesterday had no issues. Pt had chest pain two months ago but states it was different in that his pain was substernal w/pressure.  He had a negative stress test that was followed by a left heart cath and was found to have LCx disease that was stented x2.  Prior to this, 5 years ago he had prior crescendo angina that steadily increased, so  a LHC was performed and pt had 3 stents placed in LAD.    Inpatient Medications    Scheduled Meds: . [START ON 10/25/2016] aspirin  81 mg Oral Daily  . atenolol  25 mg Oral Daily  . cholecalciferol  1,000 Units Oral Daily  . clopidogrel  75 mg Oral Daily  . dutasteride  0.5 mg Oral Daily  . fenofibrate  160 mg Oral Daily  . finasteride  5 mg Oral Daily  . heparin  5,000 Units Subcutaneous Q8H  . hydrochlorothiazide  12.5 mg Oral Daily  . insulin aspart  0-15 Units Subcutaneous Q4H  . insulin glargine  15 Units Subcutaneous QHS  . omega-3 acid ethyl esters  1 g Oral Daily  . pantoprazole  40 mg Oral Daily  . predniSONE  5 mg Oral Q breakfast  . tamsulosin  0.4  mg Oral Daily  . vitamin C  100 mg Oral Daily   Continuous Infusions:  PRN Meds: acetaminophen, HYDROcodone-acetaminophen, LORazepam, methocarbamol, morphine injection, nitroGLYCERIN, ondansetron (ZOFRAN) IV   Vital Signs    Vitals:   10/24/16 1000 10/24/16 1015 10/24/16 1030 10/24/16 1045  BP: (!) 163/70 (!) 158/70 131/67 139/65  Pulse: (!) 48 (!) 52 (!) 54 (!) 50  Resp: 13 16 13 12   Temp:      TempSrc:      SpO2: 98% 99% 96% 96%  Weight:      Height:       No intake or output data in the 24 hours ending 10/24/16 1210 Filed Weights   10/23/16 1840  Weight: 178 lb (80.7 kg)    Telemetry    Sinus bradycardia- Personally Reviewed  ECG    Sinus bradycardia - Personally Reviewed  Physical Exam   GEN: No acute distress.   Neck: No JVD Cardiac: sinus brady, no murmurs, rubs, or gallops.  Respiratory: Clear to auscultation bilaterally. GI: Soft, nontender, non-distended  MS: No edema; No deformity. Neuro:  Nonfocal  Psych: Normal affect   Labs    Chemistry Recent Labs Lab 10/23/16 1854  NA 135  K 4.8  CL 104  CO2 25  GLUCOSE  122*  BUN 21*  CREATININE 1.36*  CALCIUM 9.1  GFRNONAA 46*  GFRAA 53*  ANIONGAP 6     Hematology Recent Labs Lab 10/23/16 1854  WBC 10.7*  RBC 6.68*  HGB 13.2  HCT 39.9  MCV 59.7*  MCH 19.8*  MCHC 33.1  RDW 17.3*  PLT 213    Cardiac Enzymes Recent Labs Lab 10/24/16 0353 10/24/16 0744  TROPONINI <0.03 <0.03    Recent Labs Lab 10/23/16 1912 10/23/16 2300  TROPIPOC 0.02 0.00     BNPNo results for input(s): BNP, PROBNP in the last 168 hours.   DDimer No results for input(s): DDIMER in the last 168 hours.   Radiology    Dg Chest 2 View  Result Date: 10/23/2016 CLINICAL DATA:  Chest pain. EXAM: CHEST  2 VIEW COMPARISON:  Radiographs of August 27, 2015. FINDINGS: The heart size and mediastinal contours are within normal limits. Both lungs are clear. No pneumothorax or pleural effusion is noted. The visualized  skeletal structures are unremarkable. IMPRESSION: No active cardiopulmonary disease. Electronically Signed   By: Marijo Conception, M.D.   On: 10/23/2016 19:39    Cardiac Studies   n/a  Patient Profile     83 y.o. male CAD, polymyalgia rheumatica, HTN, T2DM, presents with fleeting reproducible chest pain occurring in the axillary area.  Assessment & Plan    1. Chest Pain: pts pain reproducible, less than 5 seconds, non exertional pt very active, intermittent and atypical for angina, troponins negative, no ecg changes suggestive of ACS  -No acute intervention recommended at this time -Continue home medications -recommend discharge and follow up with Dr. Dossie Der  Signed, Vickki Muff, MD  10/24/2016, 12:10 PM    See consult note Sherren Mocha 10/24/2016 1:21 PM

## 2016-10-24 NOTE — ED Notes (Signed)
Pt placed on 2L [place] for SpO2 dropping into the 70s while asleep.

## 2016-10-24 NOTE — ED Notes (Signed)
Lattie Haw, pt's daughter would like to be notified with any updates/bed placement. #[phone].

## 2016-10-24 NOTE — Discharge Summary (Signed)
Physician Discharge Summary  Peter Carter [id number]:[id number] DOB: 1929/04/30 DOA: 10/23/2016  PCP: System, Pcp Not In  Admit date: 10/23/2016 Discharge date: 10/24/2016  Admitted From: home  Disposition:  home   Recommendations for Outpatient Follow-up:  1. F/u with cardiology in [place]  Discharge Condition:  stable   CODE STATUS:  Full code   Consultations:  cardiology    Discharge Diagnoses:  Principal Problem:   Chest pain/ CAD Active Problems:   Essential hypertension   GERD (gastroesophageal reflux disease)   CAD (coronary artery disease)   Insulin-requiring or dependent type II diabetes mellitus ([place])    Subjective: Chest pain resolved and has no recurred. No new complaints.   Brief Summary: Peter Carter is a 83 y.o. male with medical history significant for insulin-dependent diabetes mellitus, hypertension, giant cell arteritis, and coronary artery disease with 2 stents placed in June 2018, now presenting to the emergency department for evaluation of chest pain. Patient is from [place] state her he receives the majority of his care and had the 2 stents placed for an MI in June 2018. He has been doing well since that time, reports adherence with his antiplatelets, and had not experienced any angina until he was woken at 7 AM by sharp pain in the left chest that resolves spontaneously and in less than a minute. The character of this pain was the same as what he experienced with his MI, but the location was slightly different, occurring more laterally today. He had a recurrence in this several times this morning with no alleviating or exacerbating factor identified, resolving spontaneously within about a minute each time. There was no associated dyspnea, nausea, or sweats. He did not attempt any interventions for his symptoms. He notes that he has been doing a lot of swimming lately and was doing some upper body aerobic exercises that he had not done in a long time.  Hospital Course:   Chest pain  - resolved - troponin negative- per history, pain was likely musculoskeletal - cardiology has seen the patient and does not recommend further work up - he has a f/u appt with his cardiologist  Discharge Instructions  Discharge Instructions    Diet - low sodium heart healthy    Complete by:  As directed    Diet Carb Modified    Complete by:  As directed    Increase activity slowly    Complete by:  As directed      Allergies as of 10/24/2016   No Known Allergies     Medication List    TAKE these medications   aspirin 81 MG tablet Take 81 mg by mouth daily.   atenolol 25 MG tablet Commonly known as:  TENORMIN Take 25 mg by mouth daily.   dutasteride 0.5 MG capsule Commonly known as:  AVODART Take 0.5 mg by mouth daily.   esomeprazole 40 MG capsule Commonly known as:  NEXIUM Take 40 mg by mouth daily at 12 noon.   fenofibrate 145 MG tablet Commonly known as:  TRICOR Take 145 mg by mouth daily.   finasteride 5 MG tablet Commonly known as:  PROSCAR Take 5 mg by mouth daily.   FISH OIL PO Take 1 capsule by mouth daily.   hydrochlorothiazide 12.5 MG tablet Commonly known as:  HYDRODIURIL Take 1 tablet (12.5 mg total) by mouth daily.   insulin glargine 100 UNIT/ML injection Commonly known as:  LANTUS Inject 20-24 Units into the skin at bedtime.   insulin lispro protamine-lispro (  50-50) 100 UNIT/ML Susp injection Commonly known as:  HUMALOG 50/50 MIX Inject 10-30 Units into the skin 3 (three) times daily. Per sliding scale   prasugrel 5 MG Tabs tablet Commonly known as:  EFFIENT Take 5 mg by mouth daily.   predniSONE 1 MG tablet Commonly known as:  DELTASONE Take 5 mg by mouth daily with breakfast.   tamsulosin 0.4 MG Caps capsule Commonly known as:  FLOMAX Take 0.4 mg by mouth daily.   VITAMIN B 12 PO Take 1 tablet by mouth daily.   vitamin C 100 MG tablet Take 100 mg by mouth daily.   VITAMIN D PO Take 1 capsule by mouth daily.             Discharge Care Instructions        Start     Ordered   10/24/16 0000  Increase activity slowly     10/24/16 1338   10/24/16 0000  Diet - low sodium heart healthy     10/24/16 1338   10/24/16 0000  Diet Carb Modified     10/24/16 1338      No Known Allergies   Procedures/Studies:   Dg Chest 2 View  Result Date: 10/23/2016 CLINICAL DATA:  Chest pain. EXAM: CHEST  2 VIEW COMPARISON:  Radiographs of August 27, 2015. FINDINGS: The heart size and mediastinal contours are within normal limits. Both lungs are clear. No pneumothorax or pleural effusion is noted. The visualized skeletal structures are unremarkable. IMPRESSION: No active cardiopulmonary disease. Electronically Signed   By: Marijo Conception, M.D.   On: 10/23/2016 19:39        Discharge Exam: Vitals:   10/24/16 1030 10/24/16 1045  BP: 131/67 139/65  Pulse: (!) 54 (!) 50  Resp: 13 12  Temp:    SpO2: 96% 96%   Vitals:   10/24/16 1000 10/24/16 1015 10/24/16 1030 10/24/16 1045  BP: (!) 163/70 (!) 158/70 131/67 139/65  Pulse: (!) 48 (!) 52 (!) 54 (!) 50  Resp: 13 16 13 12   Temp:      TempSrc:      SpO2: 98% 99% 96% 96%  Weight:      Height:        General: Pt is alert, awake, not in acute distress Cardiovascular: RRR, S1/S2 +, no rubs, no gallops Respiratory: CTA bilaterally, no wheezing, no rhonchi Abdominal: Soft, NT, ND, bowel sounds + Extremities: no edema, no cyanosis    The results of significant diagnostics from this hospitalization (including imaging, microbiology, ancillary and laboratory) are listed below for reference.     Microbiology: No results found for this or any previous visit (from the past 240 hour(s)).   Labs: BNP (last 3 results) No results for input(s): BNP in the last 8760 hours. Basic Metabolic Panel:  Recent Labs Lab 10/23/16 1854  NA 135  K 4.8  CL 104  CO2 25  GLUCOSE 122*  BUN 21*  CREATININE 1.36*  CALCIUM 9.1   Liver Function Tests: No results for  input(s): AST, ALT, ALKPHOS, BILITOT, PROT, ALBUMIN in the last 168 hours. No results for input(s): LIPASE, AMYLASE in the last 168 hours. No results for input(s): AMMONIA in the last 168 hours. CBC:  Recent Labs Lab 10/23/16 1854  WBC 10.7*  HGB 13.2  HCT 39.9  MCV 59.7*  PLT 213   Cardiac Enzymes:  Recent Labs Lab 10/24/16 0353 10/24/16 0744  TROPONINI <0.03 <0.03   BNP: Invalid input(s): POCBNP CBG:  Recent Labs  Lab 10/24/16 0352 10/24/16 0913  GLUCAP 205* 187*   D-Dimer No results for input(s): DDIMER in the last 72 hours. Hgb A1c No results for input(s): HGBA1C in the last 72 hours. Lipid Profile No results for input(s): CHOL, HDL, LDLCALC, TRIG, CHOLHDL, LDLDIRECT in the last 72 hours. Thyroid function studies No results for input(s): TSH, T4TOTAL, T3FREE, THYROIDAB in the last 72 hours.  Invalid input(s): FREET3 Anemia work up No results for input(s): VITAMINB12, FOLATE, FERRITIN, TIBC, IRON, RETICCTPCT in the last 72 hours. Urinalysis No results found for: COLORURINE, APPEARANCEUR, LABSPEC, [place], GLUCOSEU, HGBUR, BILIRUBINUR, KETONESUR, PROTEINUR, UROBILINOGEN, NITRITE, LEUKOCYTESUR Sepsis Labs Invalid input(s): PROCALCITONIN,  WBC,  LACTICIDVEN Microbiology No results found for this or any previous visit (from the past 240 hour(s)).   Time coordinating discharge: Over 30 minutes  SIGNED:   Debbe Odea, MD  [facility] 10/24/2016, 1:52 PM Pager   If 7PM-7AM, please contact night-coverage [email or web address] Password [email or web address]

## 2016-10-24 NOTE — ED Provider Notes (Signed)
[facility] DEPT Provider Note   CSN: [id number] Arrival date & time: 10/23/16  1830     History   Chief Complaint Chief Complaint  Patient presents with  . Chest Pain    HPI Peter Carter is a 83 y.o. male.  Patient with past medical history of CAD, recent heart cath in June of this year with 2 stents placed, hypertension, diabetes, presents to the emergency department with multiple episodes of sharp fleeting chest pain. He describes pain as being left-sided. He denies any shortness of breath, radiating pain, nausea, diaphoresis, or any other associated symptoms. He states that this feels similar to when he had a prior heart attack.  He was instructed by his cardiologist (in [place]), to come to the emergency department for evaluation and observation. He has not taken anything for his symptoms. There are no other associated symptoms. There are no modifying factors.   The history is provided by the patient. No language interpreter was used.    Past Medical History:  Diagnosis Date  . BPH (benign prostatic hyperplasia)   . Coronary artery disease    stents placed  . DM (diabetes mellitus) ([place])   . Giant cell arteritis ([place])   . Hypertension     Patient Active Problem List   Diagnosis Date Noted  . CAD (coronary artery disease) 10/24/2016  . Chest pain 08/27/2015  . Essential hypertension 08/27/2015  . Lipoma 08/27/2015  . GERD (gastroesophageal reflux disease) 08/27/2015  . Cough 08/27/2015  . Pain in the chest     Past Surgical History:  Procedure Laterality Date  . CORONARY ANGIOPLASTY WITH STENT PLACEMENT         Home Medications    Prior to Admission medications   Medication Sig Start Date End Date Taking? Authorizing Provider  Ascorbic Acid (VITAMIN C) 100 MG tablet Take 100 mg by mouth daily.    [provider]  aspirin 81 MG tablet Take 81 mg by mouth daily.    [provider]  atenolol (TENORMIN) 25 MG tablet Take 25 mg by mouth  daily.    [provider]  Cholecalciferol (VITAMIN D PO) Take 1 capsule by mouth daily.    [provider]  clopidogrel (PLAVIX) 75 MG tablet Take 75 mg by mouth daily.    [provider]  Cyanocobalamin (VITAMIN B 12 PO) Take 1 tablet by mouth daily.    [provider]  dutasteride (AVODART) 0.5 MG capsule Take 0.5 mg by mouth daily.    [provider]  esomeprazole (NEXIUM) 40 MG capsule Take 40 mg by mouth daily at 12 noon.    [provider]  fenofibrate (TRICOR) 145 MG tablet Take 145 mg by mouth daily.    [provider]  finasteride (PROSCAR) 5 MG tablet Take 5 mg by mouth daily.    [provider]  hydrochlorothiazide (HYDRODIURIL) 12.5 MG tablet Take 1 tablet (12.5 mg total) by mouth daily. 08/27/15   Berenice Primas, MD  insulin glargine (LANTUS) 100 UNIT/ML injection Inject 15-22 Units into the skin at bedtime.    [provider]  insulin lispro protamine-lispro (HUMALOG 50/50 MIX) (50-50) 100 UNIT/ML SUSP injection Inject 30-34 Units into the skin 3 (three) times daily. Per sliding scale    [provider]  methocarbamol (ROBAXIN) 500 MG tablet Take 1 tablet (500 mg total) by mouth every 8 (eight) hours as needed for muscle spasms. 08/27/15   Berenice Primas, MD  Omega-3 Fatty Acids (FISH OIL  PO) Take 1 capsule by mouth daily.    [provider]  predniSONE (DELTASONE) 1 MG tablet Take 1 mg by mouth 4 (four) times daily.    [provider]  tamsulosin (FLOMAX) 0.4 MG CAPS capsule Take 0.4 mg by mouth daily.    [provider]    Family History Family History  Problem Relation Age of Onset  . Stroke Mother     Social History Social History  Substance Use Topics  . Smoking status: Never Smoker  . Smokeless tobacco: Not on file  . Alcohol use Yes     Allergies   Patient has no known allergies.   Review of Systems Review of Systems  All other systems reviewed and  are negative.    Physical Exam Updated Vital Signs BP (!) 166/54   Pulse (!) 52   Temp 97.9 F (36.6 C) (Oral)   Resp 18   Ht 5\' 5"  (1.651 m)   Wt 80.7 kg (178 lb)   SpO2 96%   BMI 29.62 kg/m   Physical Exam  Constitutional: He is oriented to person, place, and time. He appears well-developed and well-nourished.  HENT:  Head: Normocephalic and atraumatic.  Eyes: Pupils are equal, round, and reactive to light. Conjunctivae and EOM are normal. Right eye exhibits no discharge. Left eye exhibits no discharge. No scleral icterus.  Neck: Normal range of motion. Neck supple. No JVD present.  Cardiovascular: Normal rate, regular rhythm and normal heart sounds.  Exam reveals no gallop and no friction rub.   No murmur heard. Pulmonary/Chest: Effort normal and breath sounds normal. No respiratory distress. He has no wheezes. He has no rales. He exhibits no tenderness.  Abdominal: Soft. He exhibits no distension and no mass. There is no tenderness. There is no rebound and no guarding.  Musculoskeletal: Normal range of motion. He exhibits no edema or tenderness.  Neurological: He is alert and oriented to person, place, and time.  Skin: Skin is warm and dry.  Psychiatric: He has a normal mood and affect. His behavior is normal. Judgment and thought content normal.  Nursing note and vitals reviewed.    ED Treatments / Results  Labs (all labs ordered are listed, but only abnormal results are displayed) Labs Reviewed  BASIC METABOLIC PANEL - Abnormal; Notable for the following:       Result Value   Glucose, Bld 122 (*)    BUN 21 (*)    Creatinine, Ser 1.36 (*)    GFR calc non Af Amer 46 (*)    GFR calc Af Amer 53 (*)    All other components within normal limits  CBC - Abnormal; Notable for the following:    WBC 10.7 (*)    RBC 6.68 (*)    MCV 59.7 (*)    MCH 19.8 (*)    RDW 17.3 (*)    All other components within normal limits  I-STAT TROPONIN, ED  I-STAT TROPONIN, ED     EKG  EKG Interpretation  Date/Time:  Thursday October 23 2016 18:34:57 EDT Ventricular Rate:  52 PR Interval:  188 QRS Duration: 88 QT Interval:  454 QTC Calculation: 422 R Axis:   57 Text Interpretation:  Sinus bradycardia Otherwise normal ECG Confirmed by Pryor Curia [phone]) on 10/23/2016 11:59:01 PM       Radiology Dg Chest 2 View  Result Date: 10/23/2016 CLINICAL DATA:  Chest pain. EXAM: CHEST  2 VIEW COMPARISON:  Radiographs of August 27, 2015. FINDINGS: The  heart size and mediastinal contours are within normal limits. Both lungs are clear. No pneumothorax or pleural effusion is noted. The visualized skeletal structures are unremarkable. IMPRESSION: No active cardiopulmonary disease. Electronically Signed   By: Marijo Conception, M.D.   On: 10/23/2016 19:39    Procedures Procedures (including critical care time)  Medications Ordered in ED Medications - No data to display   Initial Impression / Assessment and Plan / ED Course  I have reviewed the triage vital signs and the nursing notes.  Pertinent labs & imaging results that were available during my care of the patient were reviewed by me and considered in my medical decision making (see chart for details).     Patient with chest pain. He describes the chest pain is sharp episodes which occur randomly. There are no provoking factors. On my first exam, the patient states that he has no tenderness to palpation of his anterior chest wall. Later, after discovering that he would need to be admitted to the hospital, patient states that he feels like it is in his muscles, now states that his muscles feel sore. Patient was seen by and discussed with Dr. Leonides Schanz, who agrees with plan for admission. Patient's family members have also encouraged the patient to be admitted. I feel this is the appropriate plan given the patient's age and known disease.  Appreciate Dr. Myna Hidalgo for admitting the patient.  Final Clinical Impressions(s) / ED  Diagnoses   Final diagnoses:  Chest pain, unspecified type    New Prescriptions New Prescriptions   No medications on file     Montine Circle, Hershal Coria 10/24/16 [place], Delice Bison, DO 10/24/16 [id number]

## 2016-10-24 NOTE — Consult Note (Addendum)
Progress Note  Patient Name: Peter Carter Date of Encounter: 10/24/2016  Primary Cardiologist: Drema Pry [facility])  Subjective   Pt is an 83yo male pmh w CAD s/p LAD, LCx, RCA stents, with latest stents in LCx back in June of this year,  T2DM insulin dependent, polymyalgia rheumatica, giant cell arteritis, HTN, Beta thalassemia trait, presents with chest pain that began at 7am on 10/23/16 that awoke pt from sleep, pain lasted about 5 seconds, was sharp, then came back later in the morning.  Pain was not associated with exertion, no diaphoresis, nausea, vomiting, diarrhea or shortness of breath occurred with the pain.  Pt had no chest pain during our interview other than reproducible pain in his left axillary area.  He reports that he has done an exercise at the gym that he hasn't done in over a year and wonders if it could be from that.  Pt has been very active at home since the stent and until yesterday had no issues. Pt had chest pain two months ago but states it was different in that his pain was substernal w/pressure.  He had a negative stress test that was followed by a left heart cath and was found to have LCx disease that was stented x2.  Prior to this, 5 years ago he had prior crescendo angina that steadily increased, so  a LHC was performed and pt had 3 stents placed in LAD.    Inpatient Medications    Scheduled Meds: . [START ON 10/25/2016] aspirin  81 mg Oral Daily  . atenolol  25 mg Oral Daily  . cholecalciferol  1,000 Units Oral Daily  . clopidogrel  75 mg Oral Daily  . dutasteride  0.5 mg Oral Daily  . fenofibrate  160 mg Oral Daily  . finasteride  5 mg Oral Daily  . heparin  5,000 Units Subcutaneous Q8H  . hydrochlorothiazide  12.5 mg Oral Daily  . insulin aspart  0-15 Units Subcutaneous Q4H  . insulin glargine  15 Units Subcutaneous QHS  . omega-3 acid ethyl esters  1 g Oral Daily  . pantoprazole  40 mg Oral Daily  . predniSONE  5 mg Oral Q breakfast  . tamsulosin  0.4  mg Oral Daily  . vitamin C  100 mg Oral Daily   Continuous Infusions:  PRN Meds: acetaminophen, HYDROcodone-acetaminophen, LORazepam, methocarbamol, morphine injection, nitroGLYCERIN, ondansetron (ZOFRAN) IV   Past Medical History:  Diagnosis Date  . BPH (benign prostatic hyperplasia)   . Coronary artery disease    stents placed  . DM (diabetes mellitus) ([place])   . Giant cell arteritis ([place])   . Hypertension    The patient has a family history of Stroke in his mother. No premature CAD.  Social history: The patient is married and he lives in [place]. His daughter lives in [place]. He does not smoke cigarettes. He drinks alcohol socially. He is a Personnel officer.  Review of systems: Negative except as per history of present illness  Vital Signs    Vitals:   10/24/16 1000 10/24/16 1015 10/24/16 1030 10/24/16 1045  BP: (!) 163/70 (!) 158/70 131/67 139/65  Pulse: (!) 48 (!) 52 (!) 54 (!) 50  Resp: 13 16 13 12   Temp:      TempSrc:      SpO2: 98% 99% 96% 96%  Weight:      Height:       No intake or output data in the 24 hours ending 10/24/16 1210 Filed  Weights   10/23/16 1840  Weight: 178 lb (80.7 kg)    Telemetry    Sinus bradycardia- Personally Reviewed  ECG    Sinus bradycardia - Personally Reviewed  Physical Exam   GEN: No acute distress.   Neck: No JVD Cardiac: sinus brady, no murmurs, rubs, or gallops.  Respiratory: Clear to auscultation bilaterally. GI: Soft, nontender, non-distended  MS: No edema; No deformity. Neuro:  Nonfocal  Psych: Normal affect   Labs    Chemistry Recent Labs Lab 10/23/16 1854  NA 135  K 4.8  CL 104  CO2 25  GLUCOSE 122*  BUN 21*  CREATININE 1.36*  CALCIUM 9.1  GFRNONAA 46*  GFRAA 53*  ANIONGAP 6     Hematology Recent Labs Lab 10/23/16 1854  WBC 10.7*  RBC 6.68*  HGB 13.2  HCT 39.9  MCV 59.7*  MCH 19.8*  MCHC 33.1  RDW 17.3*  PLT 213    Cardiac Enzymes Recent Labs Lab 10/24/16 0353  10/24/16 0744  TROPONINI <0.03 <0.03    Recent Labs Lab 10/23/16 1912 10/23/16 2300  TROPIPOC 0.02 0.00     BNPNo results for input(s): BNP, PROBNP in the last 168 hours.   DDimer No results for input(s): DDIMER in the last 168 hours.   Radiology    Dg Chest 2 View  Result Date: 10/23/2016 CLINICAL DATA:  Chest pain. EXAM: CHEST  2 VIEW COMPARISON:  Radiographs of August 27, 2015. FINDINGS: The heart size and mediastinal contours are within normal limits. Both lungs are clear. No pneumothorax or pleural effusion is noted. The visualized skeletal structures are unremarkable. IMPRESSION: No active cardiopulmonary disease. Electronically Signed   By: Marijo Conception, M.D.   On: 10/23/2016 19:39    Cardiac Studies   n/a  Patient Profile     83 y.o. male CAD, polymyalgia rheumatica, HTN, T2DM, presents with fleeting reproducible chest pain occurring in the axillary area.  Assessment & Plan    1. Chest Pain: pts pain reproducible, less than 5 seconds, non exertional pt very active, intermittent and atypical for angina, troponins negative, no ecg changes suggestive of ACS  -No acute intervention recommended at this time -Continue home medications -recommend discharge and follow up with Dr. Dossie Der  Signed, Vickki Muff, MD  10/24/2016, 12:10 PM    Patient seen, examined. Available data reviewed. Agree with findings, assessment, and plan as outlined by Dr Shan Levans. This patient is evaluated in consultation in the emergency department, consult requested by Dr Myna Hidalgo for evaluation of chest pain in this 83 year old gentleman with known coronary artery disease and history of PCI.  The patient is independently evaluated. On my exam today: Vitals:   10/24/16 1030 10/24/16 1045  BP: 131/67 139/65  Pulse: (!) 54 (!) 50  Resp: 13 12  Temp:    SpO2: 96% 96%   Pt is alert and oriented, elderly gentleman in NAD HEENT: normal Neck: JVP - normal, carotids 2+= without bruits Lungs: CTA  bilaterally CV: brady and regular without murmur or gallop Abd: soft, NT, Positive BS, no hepatomegaly Ext: no C/C/E, distal pulses intact and equal Skin: warm/dry no rash  EKG shows normal sinus rhythm and is within normal limits. Cardiac enzymes are normal.  The patient's chest pain is atypical. It sounds more musculoskeletal in etiology. He has done recent exercises with his arms that he hasn't done in over a year. His pain is exacerbated by moving certain ways. There is no exacerbation of pain by walking or other  types of exertion. He has been observed now for over 18 hours and his cardiac markers are completely normal. He is not experiencing any further chest pain. I feel he can be safely discharged home from the emergency room and continue on his current medications. He has a follow-up visit scheduled with his cardiologist in [place] as soon as he returns.  Sherren Mocha, M.D. 10/24/2016 1:17 PM

## 2017-06-14 IMAGING — CT CT ANGIO CHEST
2 of 6 series · 18 of 36 positions shown · IV contrast (Omni 300)
Comparison: None.

CLINICAL DATA: Sharp left-sided chest pain under the shoulder
blade.

EXAM:
CT ANGIOGRAPHY CHEST WITH CONTRAST
TECHNIQUE: Multidetector CT imaging of the chest was performed using the
standard protocol during bolus administration of intravenous
contrast. Multiplanar CT image reconstructions and MIPs were
obtained to evaluate the vascular anatomy.
CONTRAST:  100 cc Isovue 370 intravenous

[Series 6: pe thins · axial · 0.72mm/px · z∈[+1033,+1289]mm · 17 of 289 slices shown]
[im 17/289  lung]
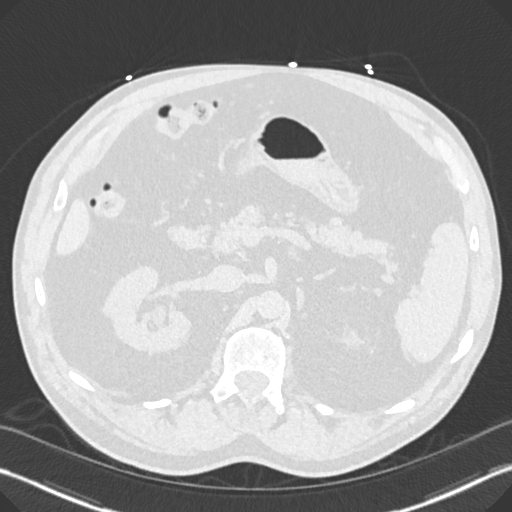
[im 33/289  mediastinal]
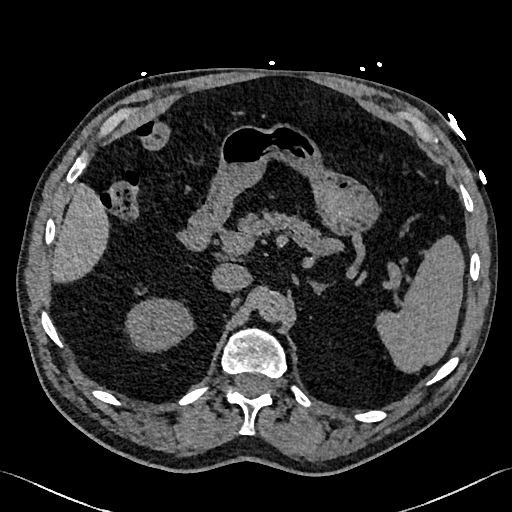
[im 49/289  lung]
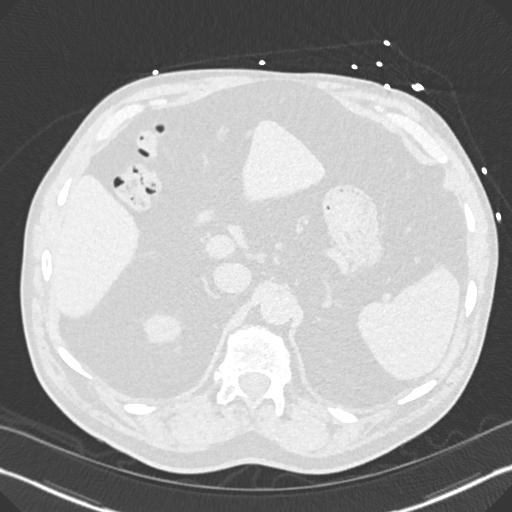
[im 65/289  mediastinal]
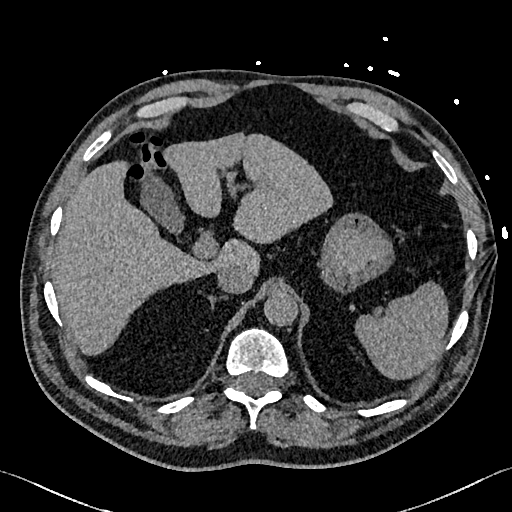
[im 81/289  lung]
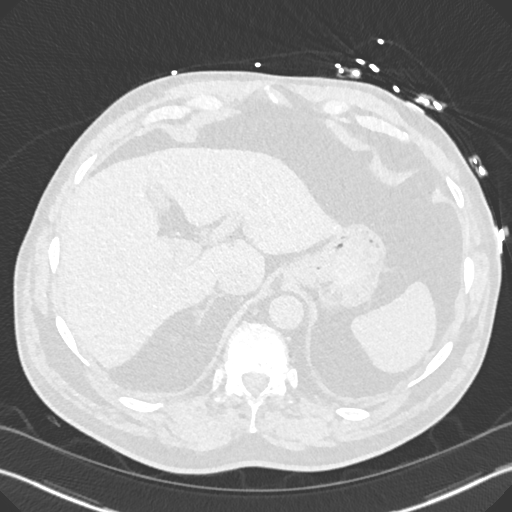
[im 97/289  mediastinal]
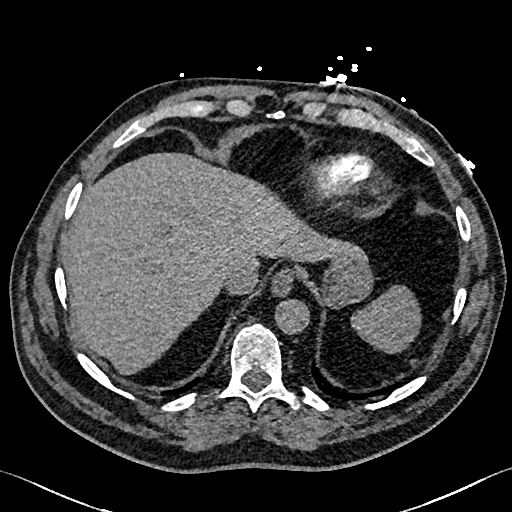
[im 113/289  lung]
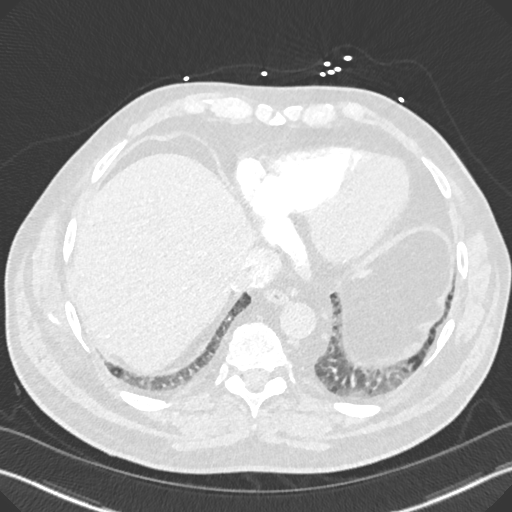
[im 129/289  mediastinal]
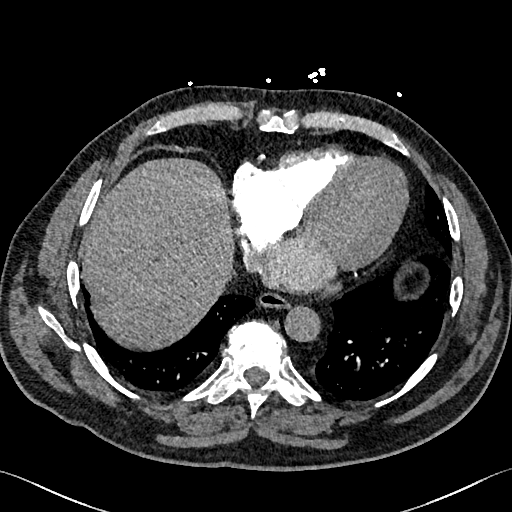
[im 145/289  lung]
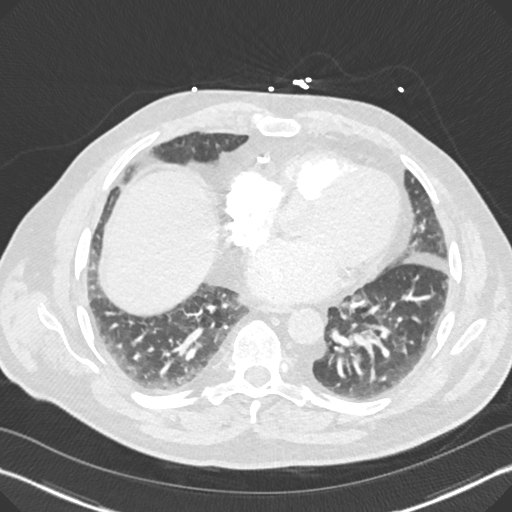
[im 161/289  mediastinal]
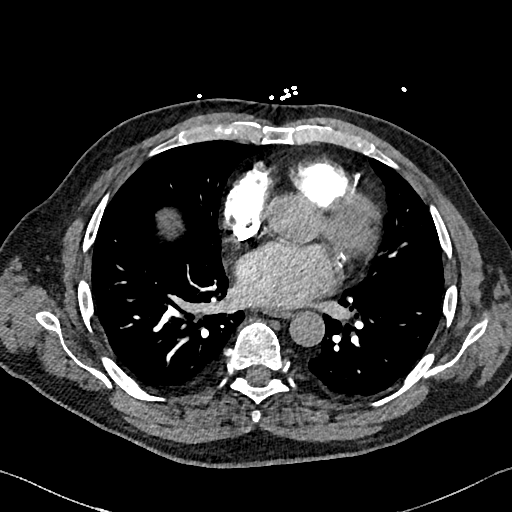
[im 177/289  lung]
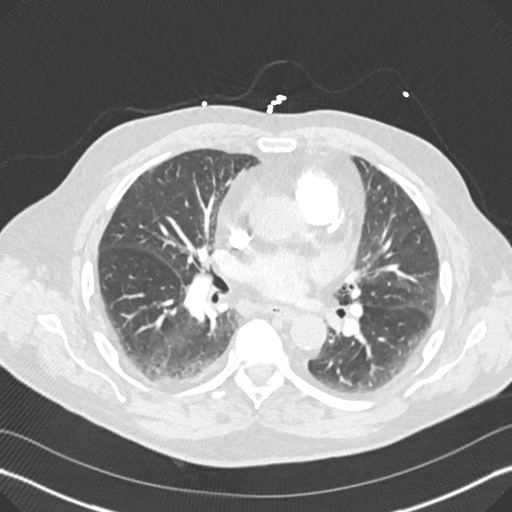
[im 193/289  mediastinal]
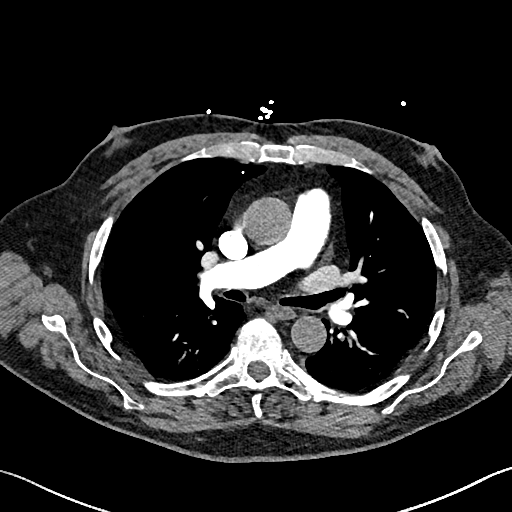
[im 209/289  lung]
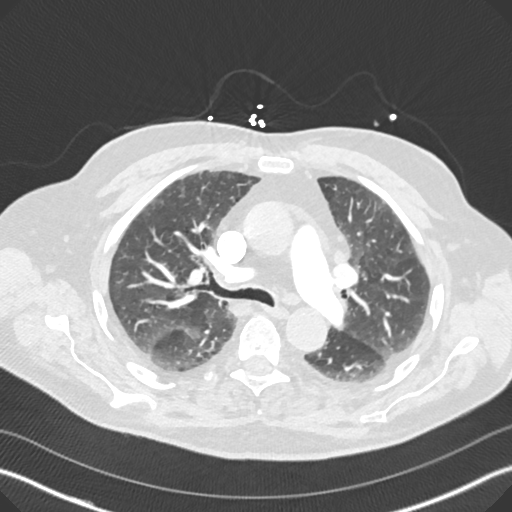
[im 225/289  mediastinal]
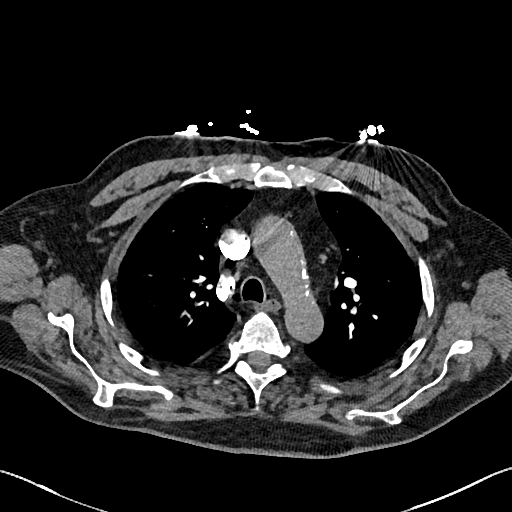
[im 241/289  lung]
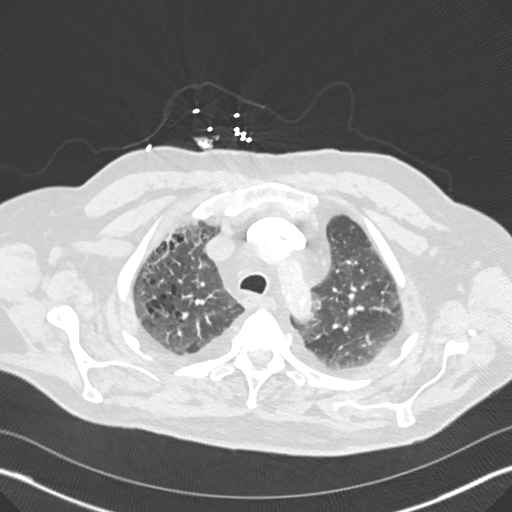
[im 257/289  mediastinal]
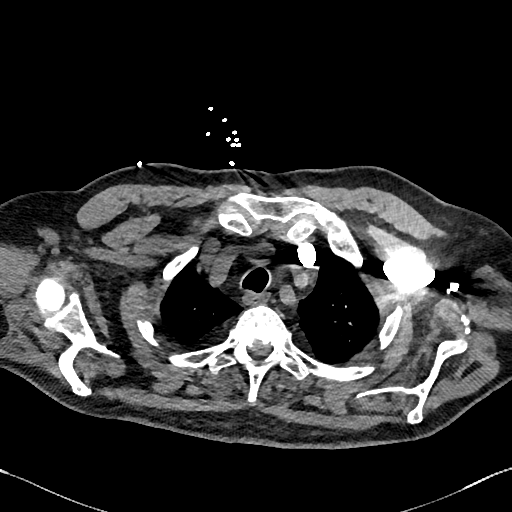
[im 273/289  lung]
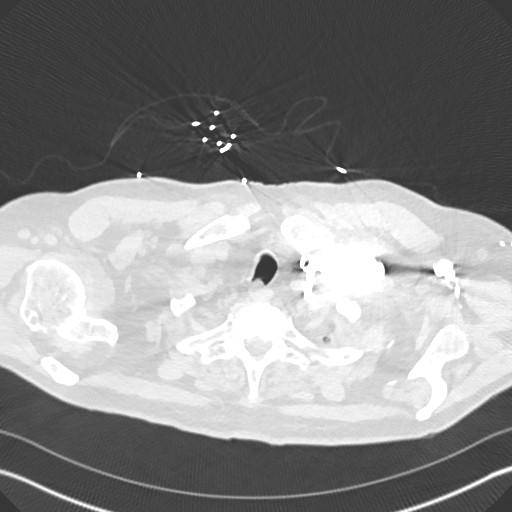

[Series 7: pe 2mm cor · coronal · 0.59mm/px · 1 of 151 slices shown]
[im 76/151  mediastinal]
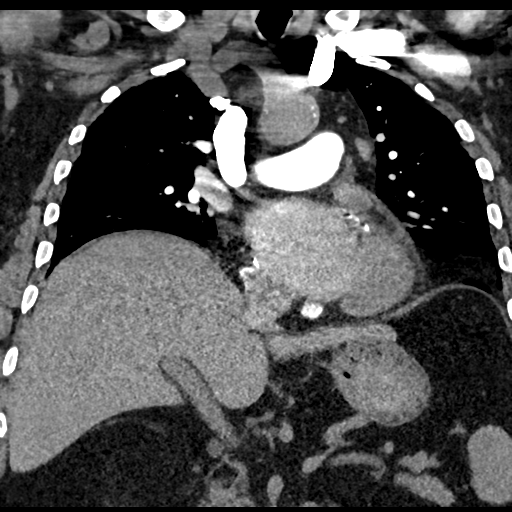

[18 of 36 positions shown; findings below may reference images not displayed]

FINDINGS: Mediastinum/Lymph Nodes: Normal heart size. No pericardial effusion.
Non opacification of the left heart and systemic arterial tree. No
evidence of acute aortic syndrome. No evidence of pulmonary
embolism. Atherosclerosis, including the coronary arteries. Negative
for adenopathy.

Lungs/Pleura: Increased density the lungs is likely from expiratory
phase imaging. There is mild air trapping at the bases.
Emphysematous changes at the right more than left apex. No
suspicious pulmonary nodule.

Upper abdomen: No acute findings.

Musculoskeletal: Partially visualized deltoid lipoma on the right
measuring at least 4 cm, simple where seen. Diffuse disc
degeneration. Advanced bilateral glenohumeral osteoarthritis.

Review of the MIP images confirms the above findings.
IMPRESSION: 1. No evidence of pulmonary embolism or other acute finding.
2. Aortic atherosclerosis and other chronic findings noted above.

## 2018-06-02 DEATH — deceased

## 2018-08-11 IMAGING — CR DG CHEST 2V
2 series · 2 of 2 positions shown · non-contrast
Comparison: Radiographs August 27, 2015.

CLINICAL DATA: Chest pain.

EXAM:
CHEST  2 VIEW

[chest pa]
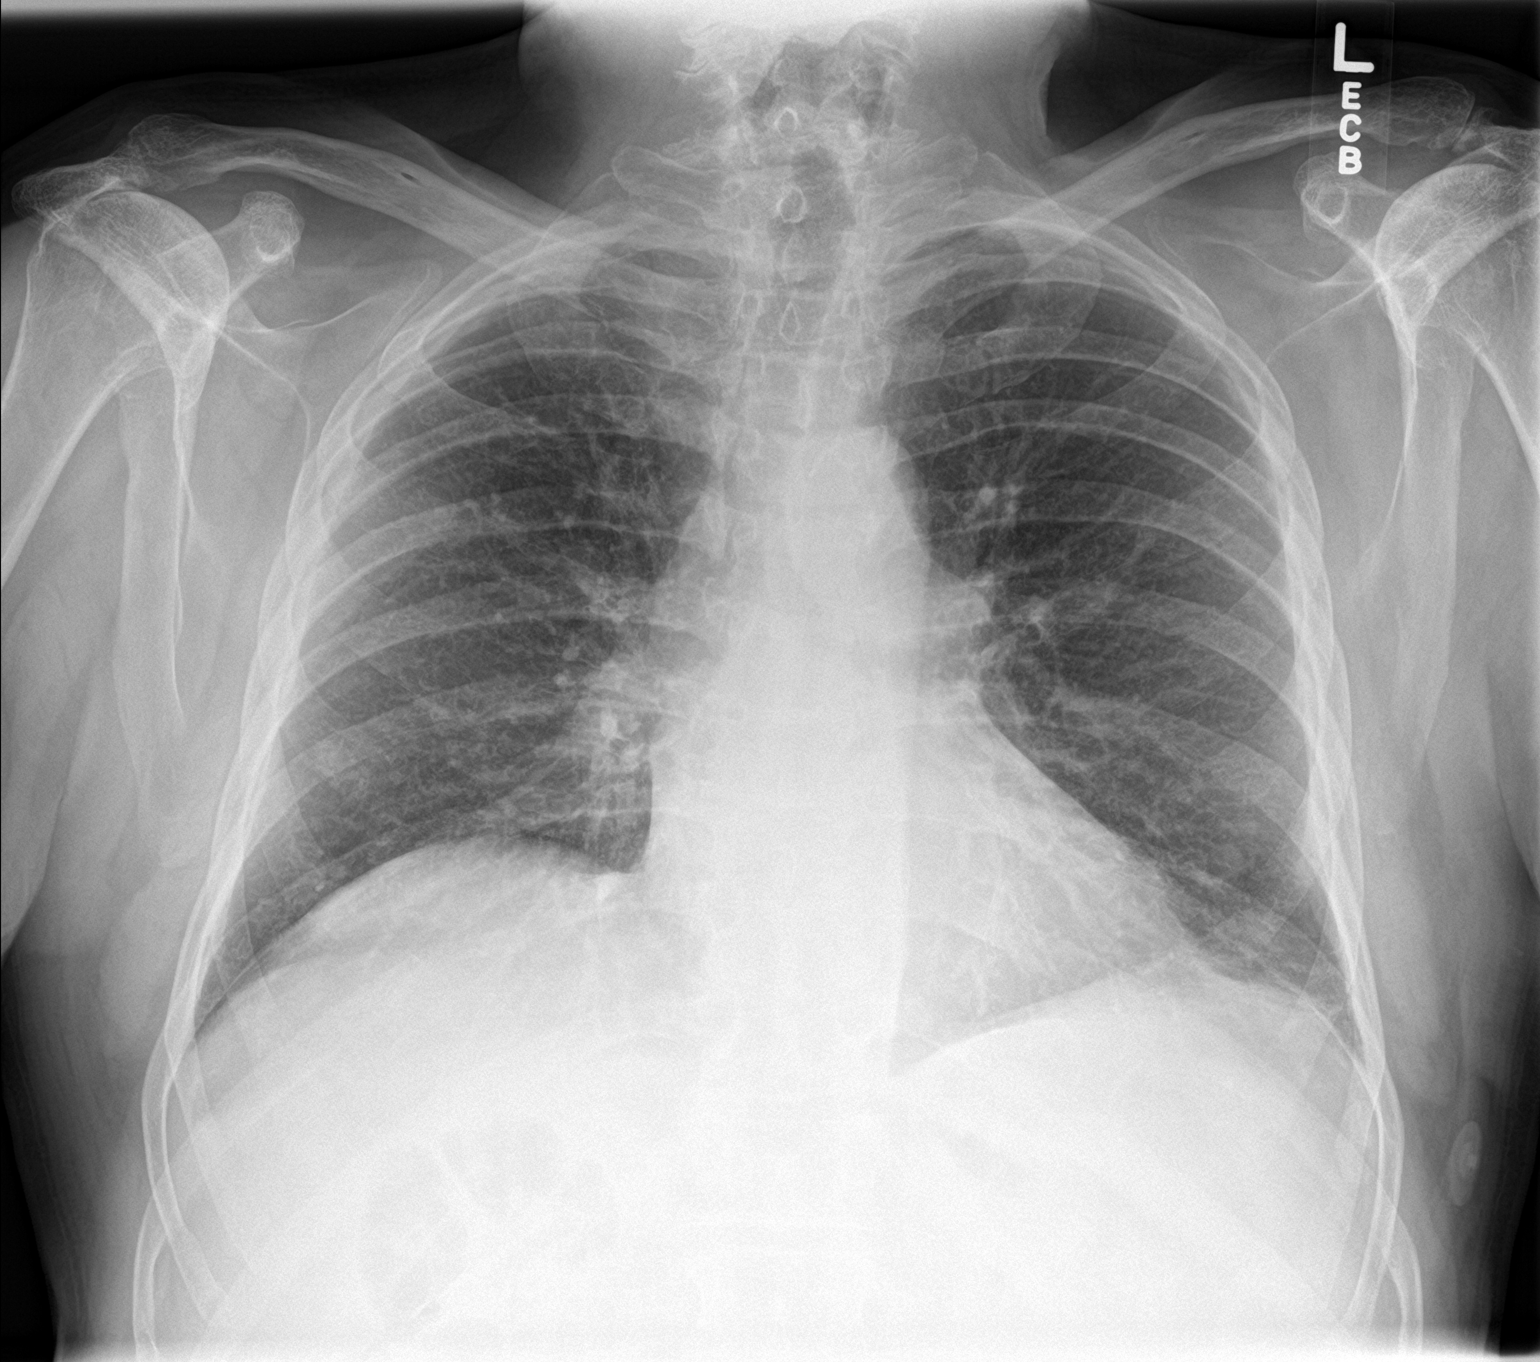

[chest lat]
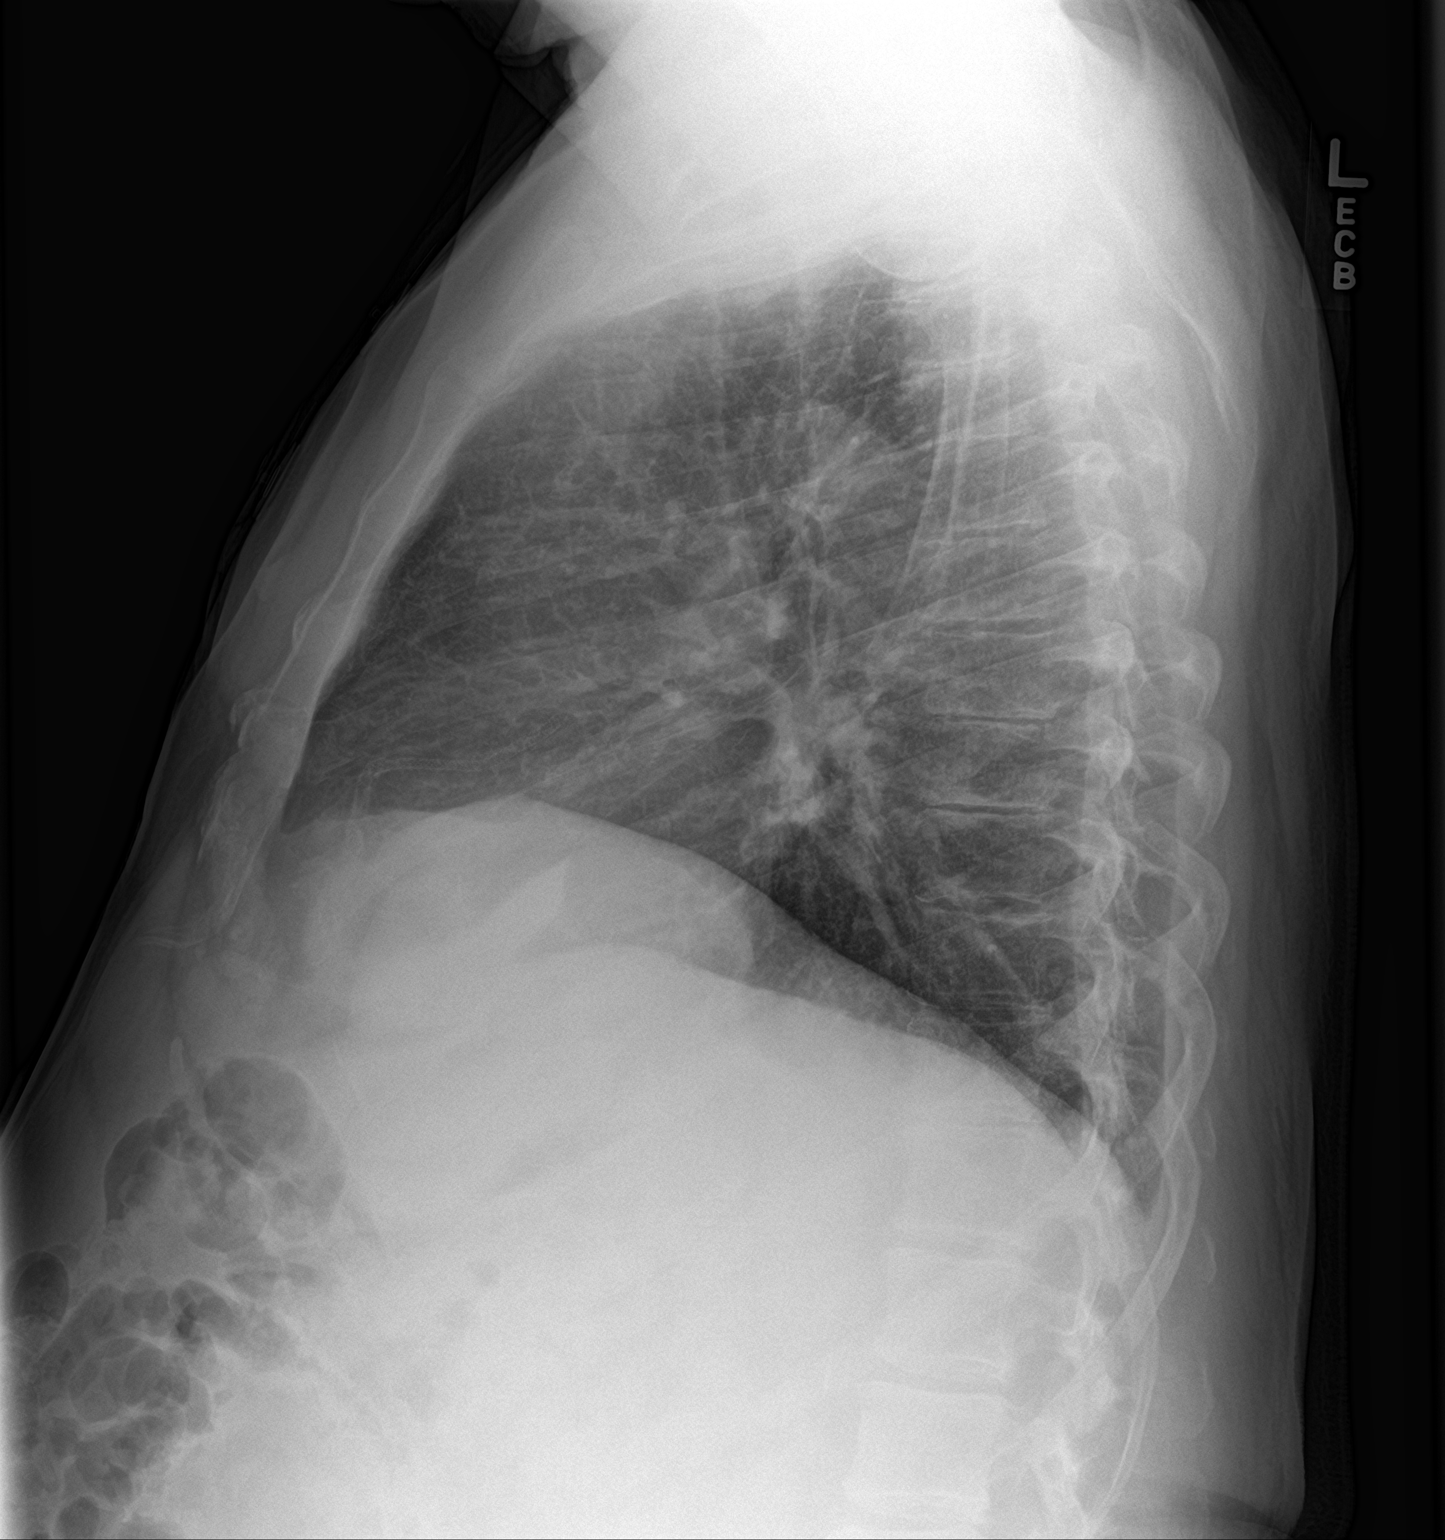

[2 of 2 positions shown; findings below may reference images not displayed]

FINDINGS: The heart size and mediastinal contours are within normal limits.
Both lungs are clear. No pneumothorax or pleural effusion is noted.
The visualized skeletal structures are unremarkable.
IMPRESSION: No active cardiopulmonary disease.
